# Patient Record
Sex: Female | Born: 1985 | Race: White | Hispanic: No | Smoking: Current every day smoker
Health system: Southern US, Community
[De-identification: ages and names within clinical notes are randomized; demographics above are authoritative.]

## PROBLEM LIST (undated history)

## (undated) DIAGNOSIS — E039 Hypothyroidism, unspecified: Secondary | ICD-10-CM

## (undated) DIAGNOSIS — E669 Obesity, unspecified: Secondary | ICD-10-CM

## (undated) HISTORY — PX: TUBAL LIGATION: SHX77

## (undated) HISTORY — PX: OTHER SURGICAL HISTORY: SHX169

## (undated) HISTORY — PX: CHOLECYSTECTOMY: SHX55

---

## 2016-09-24 ENCOUNTER — Ambulatory Visit (HOSPITAL_COMMUNITY)
Admission: EM | Admit: 2016-09-24 | Discharge: 2016-09-24 | Disposition: A | Discharge status: Home / Self Care | Payer: BLUE CROSS/BLUE SHIELD | Attending: Family Medicine | Admitting: Family Medicine

## 2016-09-24 ENCOUNTER — Encounter (HOSPITAL_COMMUNITY): Payer: Self-pay | Admitting: *Deleted

## 2016-09-24 DIAGNOSIS — R109 Unspecified abdominal pain: Secondary | ICD-10-CM

## 2016-09-24 HISTORY — DX: Obesity, unspecified: E66.9

## 2016-09-24 MED ORDER — KETOROLAC TROMETHAMINE 30 MG/ML IJ SOLN
INTRAMUSCULAR | Status: AC
  Filled 2016-09-24: qty 1

## 2016-09-24 MED ORDER — NAPROXEN 500 MG PO TABS: 500.0000 mg | ORAL_TABLET | Freq: Two times a day (BID) | ORAL | 0 refills | Status: DC

## 2016-09-24 MED ORDER — KETOROLAC TROMETHAMINE 30 MG/ML IJ SOLN
30.0000 mg | Freq: Once | INTRAMUSCULAR | Status: AC
  Administered 2016-09-24: 30 mg via INTRAMUSCULAR

## 2016-09-24 NOTE — Discharge Instructions (Signed)
Referral has been made to gynecology, and you've been given injection of Toradol in clinic, and I have started you on Naprosyn, one tablet twice a day. The women's outpatient clinic should call you at some point to schedule an appointment for evaluation.

## 2016-09-24 NOTE — ED Triage Notes (Signed)
Patient reports she woke up this am with severe abdominal cramping and heavy bleeding. States bleeding has now decreased. States occurrences of this has increase since getting tubes tied. No OBGYN at this time.

## 2016-09-24 NOTE — ED Provider Notes (Signed)
CSN: [id number]     Arrival date & time 09/24/16  1416 History   None    Chief Complaint  Patient presents with  . Abdominal Cramping  . Menorrhagia   (Consider location/radiation/quality/duration/timing/severity/associated sxs/prior Treatment) 31 year old female presents with dysmenorrhea and abdominal pain and cramping. This is been ongoing for 2 years, however has worsened this morning, stating she woke up "in a pool of blood" this has since stopped, she is not sexually active, reports having her "tubes tied" 2 years ago. She has no weakness, dizziness, chest pain, shortness of breath, or other markers of volume depletion, or anemia. She is not currently sexually active, not on any birth control. She is also requesting referral to gynecology.      Past Medical History:  Diagnosis Date  . Obesity    Past Surgical History:  Procedure Laterality Date  . CESAREAN SECTION    . CHOLECYSTECTOMY    . tonsilectomy    . TUBAL LIGATION     Family History  Problem Relation Age of Onset  . Diabetes Father   . Diabetes Sister    Social History  Substance Use Topics  . Smoking status: Current Every Day Smoker    Packs/day: 1.00    Years: 13.00  . Smokeless tobacco: Never Used  . Alcohol use No   OB History    Gravida Para Term Preterm AB Living   4 4 3 1  0 0   SAB TAB Ectopic Multiple Live Births   0 0 0 0 0     Review of Systems  Constitutional: Negative.   HENT: Negative.   Respiratory: Negative.   Cardiovascular: Negative.   Gastrointestinal: Positive for abdominal pain. Negative for diarrhea, nausea and vomiting.  Genitourinary: Positive for menstrual problem, pelvic pain and vaginal bleeding. Negative for difficulty urinating, dyspareunia and flank pain.  Musculoskeletal: Negative.   Skin: Negative.     Allergies  Amoxicillin  Home Medications   Prior to Admission medications   Medication Sig Start Date End Date Taking? Authorizing Provider  naproxen  (NAPROSYN) 500 MG tablet Take 1 tablet (500 mg total) by mouth 2 (two) times daily. 09/24/16   Dorena BodoKennard, Brittin Belnap, NP   Meds Ordered and Administered this Visit   Medications  ketorolac (TORADOL) 30 MG/ML injection 30 mg (30 mg Intramuscular Given 09/24/16 1537)    BP 121/70 (BP Location: Left Arm)   Pulse 74   Temp 97.8 F (36.6 C) (Oral)   Resp 16   SpO2 100%  No data found.   Physical Exam  Constitutional: She is oriented to person, place, and time. She appears well-developed and well-nourished. No distress.  HENT:  Head: Normocephalic and atraumatic.  Right Ear: External ear normal.  Left Ear: External ear normal.  Eyes: Conjunctivae are normal.  Neck: Normal range of motion.  Cardiovascular: Normal rate and regular rhythm.   Pulmonary/Chest: Effort normal and breath sounds normal.  Abdominal: Soft. Normal appearance. There is tenderness in the suprapubic area. There is no rebound.  Neurological: She is alert and oriented to person, place, and time.  Skin: Skin is warm and dry. Capillary refill takes less than 2 seconds. No rash noted. She is not diaphoretic. No erythema.  Psychiatric: She has a normal mood and affect. Her behavior is normal.  Nursing note and vitals reviewed.   Urgent Care Course     Procedures (including critical care time)  Labs Review Labs Reviewed - No data to display  Imaging Review No results found.  MDM   1. Abdominal cramping    Injection of Toradol given, prescription for Naprosyn, referral made to the women's outpatient clinic for further evaluation and management of these symptoms    Dorena BodoKennard, Fallyn Munnerlyn, NP 09/24/16 1726

## 2016-09-24 NOTE — ED Notes (Signed)
Dr hagler spoke to patient.  Patient to be seen at [facility].  Sent to lobby for intake assessment in order of arrival

## 2017-03-24 ENCOUNTER — Encounter (HOSPITAL_COMMUNITY): Payer: Self-pay | Admitting: Emergency Medicine

## 2017-03-24 ENCOUNTER — Other Ambulatory Visit: Payer: Self-pay

## 2017-03-24 DIAGNOSIS — R109 Unspecified abdominal pain: Secondary | ICD-10-CM | POA: Diagnosis not present

## 2017-03-24 DIAGNOSIS — Z79899 Other long term (current) drug therapy: Secondary | ICD-10-CM | POA: Insufficient documentation

## 2017-03-24 DIAGNOSIS — F17210 Nicotine dependence, cigarettes, uncomplicated: Secondary | ICD-10-CM | POA: Insufficient documentation

## 2017-03-24 DIAGNOSIS — R112 Nausea with vomiting, unspecified: Secondary | ICD-10-CM | POA: Diagnosis not present

## 2017-03-24 NOTE — ED Triage Notes (Signed)
Reports intermittent stabbing pain to right flank for three weeks that has become more constant.  Denies any urinary symptoms.

## 2017-03-25 ENCOUNTER — Emergency Department (HOSPITAL_COMMUNITY): Payer: BLUE CROSS/BLUE SHIELD

## 2017-03-25 ENCOUNTER — Emergency Department (HOSPITAL_COMMUNITY)
Admission: EM | Admit: 2017-03-25 | Discharge: 2017-03-25 | Disposition: A | Discharge status: Home / Self Care | Payer: BLUE CROSS/BLUE SHIELD | Attending: Emergency Medicine | Admitting: Emergency Medicine

## 2017-03-25 DIAGNOSIS — R109 Unspecified abdominal pain: Secondary | ICD-10-CM

## 2017-03-25 DIAGNOSIS — R112 Nausea with vomiting, unspecified: Secondary | ICD-10-CM | POA: Diagnosis not present

## 2017-03-25 LAB — URINALYSIS, ROUTINE W REFLEX MICROSCOPIC
Bacteria, UA: NONE SEEN
Bilirubin Urine: NEGATIVE
Glucose, UA: NEGATIVE mg/dL
Ketones, ur: NEGATIVE mg/dL
Leukocytes, UA: NEGATIVE
NITRITE: NEGATIVE
PH: 5 (ref 5.0–8.0)
Protein, ur: NEGATIVE mg/dL
SPECIFIC GRAVITY, URINE: 1.023 (ref 1.005–1.030)

## 2017-03-25 LAB — COMPREHENSIVE METABOLIC PANEL
ALBUMIN: 4.4 g/dL (ref 3.5–5.0)
ALK PHOS: 71 U/L (ref 38–126)
ALT: 20 U/L (ref 14–54)
AST: 16 U/L (ref 15–41)
Anion gap: 12 (ref 5–15)
BILIRUBIN TOTAL: 1 mg/dL (ref 0.3–1.2)
BUN: 9 mg/dL (ref 6–20)
CALCIUM: 9.3 mg/dL (ref 8.9–10.3)
CO2: 24 mmol/L (ref 22–32)
Chloride: 103 mmol/L (ref 101–111)
Creatinine, Ser: 0.73 mg/dL (ref 0.44–1.00)
GFR calc Af Amer: >60 mL/min (ref >60)
GFR calc non Af Amer: >60 mL/min (ref >60)
GLUCOSE: 85 mg/dL (ref 65–99)
Potassium: 3.6 mmol/L (ref 3.5–5.1)
Sodium: 139 mmol/L (ref 135–145)
TOTAL PROTEIN: 7.4 g/dL (ref 6.5–8.1)

## 2017-03-25 LAB — CBC WITH DIFFERENTIAL/PLATELET
BASOS ABS: 0 10*3/uL (ref 0.0–0.1)
Basophils Relative: 0 %
Eosinophils Absolute: 0.1 10*3/uL (ref 0.0–0.7)
Eosinophils Relative: 1 %
HEMATOCRIT: 35.8 % — AB (ref 36.0–46.0)
HEMOGLOBIN: 11.5 g/dL — AB (ref 12.0–15.0)
LYMPHS PCT: 29 %
Lymphs Abs: 2.3 10*3/uL (ref 0.7–4.0)
MCH: 21.2 pg — ABNORMAL LOW (ref 26.0–34.0)
MCHC: 32.1 g/dL (ref 30.0–36.0)
MCV: 66.1 fL — ABNORMAL LOW (ref 78.0–100.0)
MONOS PCT: 6 %
Monocytes Absolute: 0.5 10*3/uL (ref 0.1–1.0)
NEUTROS ABS: 5 10*3/uL (ref 1.7–7.7)
Neutrophils Relative %: 64 %
Platelets: 143 10*3/uL — ABNORMAL LOW (ref 150–400)
RBC: 5.42 MIL/uL — AB (ref 3.87–5.11)
RDW: 15.3 % (ref 11.5–15.5)
WBC: 7.9 10*3/uL (ref 4.0–10.5)

## 2017-03-25 LAB — POC URINE PREG, ED: PREG TEST UR: NEGATIVE

## 2017-03-25 MED ORDER — KETOROLAC TROMETHAMINE 30 MG/ML IJ SOLN
30.0000 mg | Freq: Once | INTRAMUSCULAR | Status: AC
  Administered 2017-03-25: 30 mg via INTRAVENOUS
  Filled 2017-03-25: qty 1

## 2017-03-25 MED ORDER — SODIUM CHLORIDE 0.9 % IV BOLUS (SEPSIS)
1000.0000 mL | Freq: Once | INTRAVENOUS | Status: AC
  Administered 2017-03-25: 1000 mL via INTRAVENOUS

## 2017-03-25 MED ORDER — METHOCARBAMOL 500 MG PO TABS: 500.0000 mg | ORAL_TABLET | Freq: Three times a day (TID) | ORAL | 0 refills | Status: DC | PRN

## 2017-03-25 MED ORDER — ONDANSETRON HCL 4 MG/2ML IJ SOLN
4.0000 mg | Freq: Once | INTRAMUSCULAR | Status: AC
  Administered 2017-03-25: 4 mg via INTRAVENOUS
  Filled 2017-03-25: qty 2

## 2017-03-25 NOTE — Discharge Instructions (Addendum)
You may alternate Tylenol 1000 mg every 6 hours as needed for pain and Ibuprofen 800 mg every 8 hours as needed for pain.  Please take Ibuprofen with food.   Your labs including your blood counts, kidney function, liver tests were normal today.  Your CT scan was also normal and showed normal-appearing kidneys, normal uterus and ovaries and normal appendix.  Your urine showed no sign of infection.  Your pregnancy test was negative.  Please alternate between Tylenol and Motrin for pain and we are discharging you with muscle relaxers.  If pain is not improving or worsens, please follow-up with your primary care provider for further management.

## 2017-03-25 NOTE — ED Provider Notes (Signed)
TIME SEEN: 2:44 AM  CHIEF COMPLAINT: Right flank pain  HPI: Patient is a 32 year old female with history of obesity, previous pyelonephritis who presents to the emergency department with 3 weeks of right flank pain.  Pain radiates into the lower aspect of the abdomen.  Described as sharp, moderate and constant.  No dysuria or hematuria.  Is currently on her menstrual cycle.  No vaginal discharge.  No history of kidney stones in the past.  No injury to her back.  Pain is not worse with movement or palpation.  Pain has caused her to have some nausea and vomiting.  No diarrhea.  No fevers.  No numbness, tingling or focal weakness.  She is ambulatory without difficulty.  No bowel or bladder incontinence.  ROS: See HPI Constitutional: no fever  Eyes: no drainage  ENT: no runny nose   Cardiovascular:  no chest pain  Resp: no SOB  GI:  vomiting GU: no dysuria Integumentary: no rash  Allergy: no hives  Musculoskeletal: no leg swelling  Neurological: no slurred speech ROS otherwise negative  PAST MEDICAL HISTORY/PAST SURGICAL HISTORY:  Past Medical History:  Diagnosis Date  . Obesity     MEDICATIONS:  Prior to Admission medications   Medication Sig Start Date End Date Taking? Authorizing Provider  naproxen (NAPROSYN) 500 MG tablet Take 1 tablet (500 mg total) by mouth 2 (two) times daily. 09/24/16   Dorena Bodo, NP    ALLERGIES:  Allergies  Allergen Reactions  . Amoxicillin     SOCIAL HISTORY:  Social History   Tobacco Use  . Smoking status: Current Every Day Smoker    Packs/day: 1.00    Years: 13.00    Pack years: 13.00  . Smokeless tobacco: Never Used  Substance Use Topics  . Alcohol use: No    FAMILY HISTORY: Family History  Problem Relation Age of Onset  . Diabetes Father   . Diabetes Sister     EXAM: BP 130/82 (BP Location: Right Arm)   Pulse 92   Temp 97.9 F (36.6 C) (Oral)   Resp 18   Ht 5\' 10"  (1.778 m)   Wt 120.2 kg (265 lb)   SpO2 100%   BMI  38.02 kg/m  CONSTITUTIONAL: Alert and oriented and responds appropriately to questions. Well-appearing; well-nourished HEAD: Normocephalic EYES: Conjunctivae clear, pupils appear equal, EOMI ENT: normal nose; moist mucous membranes NECK: Supple, no meningismus, no nuchal rigidity, no LAD  CARD: RRR; S1 and S2 appreciated; no murmurs, no clicks, no rubs, no gallops RESP: Normal chest excursion without splinting or tachypnea; breath sounds clear and equal bilaterally; no wheezes, no rhonchi, no rales, no hypoxia or respiratory distress, speaking full sentences ABD/GI: Normal bowel sounds; non-distended; soft, non-tender, no rebound, no guarding, no peritoneal signs, no hepatosplenomegaly BACK:  The back appears normal and is non-tender to palpation, there is no CVA tenderness, no midline spinal tenderness or step-off or deformity EXT: Normal ROM in all joints; non-tender to palpation; no edema; normal capillary refill; no cyanosis, no calf tenderness or swelling    SKIN: Normal color for age and race; warm; no rash NEURO: Moves all extremities equally, sensation to light touch intact diffusely, normal gait PSYCH: The patient's mood and manner are appropriate. Grooming and personal hygiene are appropriate.  MEDICAL DECISION MAKING: Patient here with right flank pain.  States that time is severe and causes her to have nausea and vomiting.  Concern for possible stone.  Abdominal exam is benign.  Doubt appendicitis, colitis, bowel  obstruction, diverticulitis.  Urine shows moderate hemoglobin but only 0-5 red blood cells and no sign of infection.  No abdominal tenderness on exam.  Will obtain labs and CT of her abdomen pelvis.  Will give IV fluids, Toradol, Zofran.  ED PROGRESS: Patient's labs are reassuring.  Normal LFTs, no leukocytosis.  CT scan shows no renal or ureteral stone.  Appendix is normal.  No hydronephrosis.  Uterus and adnexa are unremarkable.  She has no tenderness throughout her abdomen.   She has had a cholecystectomy.  Doubt PID, torsion, ovarian cyst based on benign exam and normal CT scan.  May be musculoskeletal pain.  Pain improved after Toradol.  Will discharge with ibuprofen, Robaxin and have her follow-up with her PCP.   At this time, I do not feel there is any life-threatening condition present. I have reviewed and discussed all results (EKG, imaging, lab, urine as appropriate) and exam findings with patient/family. I have reviewed nursing notes and appropriate previous records.  I feel the patient is safe to be discharged home without further emergent workup and can continue workup as an outpatient as needed. Discussed usual and customary return precautions. Patient/family verbalize understanding and are comfortable with this plan.  Outpatient follow-up has been provided if needed. All questions have been answered.      Ward, Layla MawKristen N, DO 03/25/17 [phone]

## 2017-03-25 NOTE — ED Notes (Signed)
ED Provider at bedside. 

## 2017-10-07 ENCOUNTER — Ambulatory Visit (HOSPITAL_COMMUNITY)
Admission: EM | Admit: 2017-10-07 | Discharge: 2017-10-07 | Disposition: A | Discharge status: Home / Self Care | Payer: BLUE CROSS/BLUE SHIELD | Attending: Family Medicine | Admitting: Family Medicine

## 2017-10-07 ENCOUNTER — Encounter (HOSPITAL_COMMUNITY): Payer: Self-pay | Admitting: Family Medicine

## 2017-10-07 DIAGNOSIS — R430 Anosmia: Secondary | ICD-10-CM

## 2017-10-07 DIAGNOSIS — R509 Fever, unspecified: Secondary | ICD-10-CM | POA: Diagnosis not present

## 2017-10-07 MED ORDER — AZITHROMYCIN 250 MG PO TABS: 250.0000 mg | ORAL_TABLET | Freq: Every day | ORAL | 0 refills | Status: DC

## 2017-10-07 NOTE — ED Triage Notes (Signed)
Pt sts URI sx x 4 days  

## 2017-10-07 NOTE — Discharge Instructions (Addendum)
Your symptoms could represent cat scratch affect or toxoplasmosis.  In either event the medicine prescribed should help you get over this.

## 2017-10-07 NOTE — ED Provider Notes (Addendum)
[facility]   [id number] 10/07/17 Arrival Time: 1556   SUBJECTIVE:  Abigail Rangel is a 32 y.o. female who presents to the urgent care with complaint of loss of taste and smell for the past 4 days, associated with intermittent sore throat.  There has been a low grade fever in the evening as well  Patient had cat scratches one week ago.  Patient was a smoker and is vaping now.  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  [email or web address]  Patient has recently had some spotting (S/P BTL and some cramping)   Past Medical History:  Diagnosis Date  . Obesity    Family History  Problem Relation Age of Onset  . Diabetes Father   . Diabetes Sister    Social History   Socioeconomic History  . Marital status: Legally Separated    Spouse name: Not on file  . Number of children: Not on file  . Years of education: Not on file  . Highest education level: Not on file  Occupational History  . Not on file  Social Needs  . Financial resource strain: Not on file  . Food insecurity:    Worry: Not on file    Inability: Not on file  . Transportation needs:    Medical: Not on file    Non-medical: Not on file  Tobacco Use  . Smoking status: Current Every Day Smoker    Packs/day: 1.00    Years: 13.00    Pack years: 13.00  . Smokeless tobacco: Never Used  Substance and Sexual Activity  . Alcohol use: No  . Drug use: No  . Sexual activity: Not Currently    Birth control/protection: Surgical  Lifestyle  . Physical activity:    Days per week: Not on file    Minutes per session: Not on file  . Stress: Not on file  Relationships  . Social connections:    Talks on phone: Not on file    Gets together: Not on file    Attends religious service: Not on file    Active member of club or organization: Not on file    Attends meetings of clubs or organizations: Not on file    Relationship status: Not on file  . Intimate partner violence:    Fear of current or ex  partner: Not on file    Emotionally abused: Not on file    Physically abused: Not on file    Forced sexual activity: Not on file  Other Topics Concern  . Not on file  Social History Narrative  . Not on file   No outpatient medications have been marked as taking for the 10/07/17 encounter [facility](Hospital Encounter).   Allergies  Allergen Reactions  . Amoxicillin Hives      ROS: As per HPI, remainder of ROS negative.   OBJECTIVE:   Vitals:   10/07/17 1707  BP: 120/76  Pulse: 76  Resp: 18  Temp: 98.3 F (36.8 C)  TempSrc: Oral  SpO2: 100%     General appearance: alert; no distress Eyes: PERRL; EOMI; conjunctiva normal HENT: normocephalic; atraumatic; TMs normal, canal normal, external ears normal without trauma; nasal mucosa normal; oral mucosa normal Neck: supple; minimal cervical adenopathy Lungs: clear to auscultation bilaterally Heart: regular rate and rhythm Abdomen: soft, non-tender; bowel sounds normal; no masses or organomegaly; no guarding or rebound tenderness Back: no CVA tenderness Extremities: no cyanosis or edema; symmetrical with no gross deformities Skin: warm and  dry; scratches on inner right biceps noted with no erythema. Neurologic: normal gait; grossly normal Psychological: alert and cooperative; normal mood and affect      Labs:  Results for orders placed or performed during the hospital encounter of 03/25/17  Urinalysis, Routine w reflex microscopic- may I&O cath if menses  Result Value Ref Range   Color, Urine YELLOW YELLOW   APPearance HAZY (A) CLEAR   Specific Gravity, Urine 1.023 1.005 - 1.030   pH 5.0 5.0 - 8.0   Glucose, UA NEGATIVE NEGATIVE mg/dL   Hgb urine dipstick MODERATE (A) NEGATIVE   Bilirubin Urine NEGATIVE NEGATIVE   Ketones, ur NEGATIVE NEGATIVE mg/dL   Protein, ur NEGATIVE NEGATIVE mg/dL   Nitrite NEGATIVE NEGATIVE   Leukocytes, UA NEGATIVE NEGATIVE   RBC / HPF 0-5 0 - 5 RBC/hpf   WBC, UA 0-5 0 - 5 WBC/hpf   Bacteria,  UA NONE SEEN NONE SEEN   Squamous Epithelial / LPF 0-5 (A) NONE SEEN   Mucus PRESENT   CBC with Differential  Result Value Ref Range   WBC 7.9 4.0 - 10.5 K/uL   RBC 5.42 (H) 3.87 - 5.11 MIL/uL   Hemoglobin 11.5 (L) 12.0 - 15.0 g/dL   HCT 40.9 (L) 81.1 - 91.4 %   MCV 66.1 (L) 78.0 - 100.0 fL   MCH 21.2 (L) 26.0 - 34.0 pg   MCHC 32.1 30.0 - 36.0 g/dL   RDW 78.2 95.6 - 21.3 %   Platelets 143 (L) 150 - 400 K/uL   Neutrophils Relative % 64 %   Lymphocytes Relative 29 %   Monocytes Relative 6 %   Eosinophils Relative 1 %   Basophils Relative 0 %   Neutro Abs 5.0 1.7 - 7.7 K/uL   Lymphs Abs 2.3 0.7 - 4.0 K/uL   Monocytes Absolute 0.5 0.1 - 1.0 K/uL   Eosinophils Absolute 0.1 0.0 - 0.7 K/uL   Basophils Absolute 0.0 0.0 - 0.1 K/uL   RBC Morphology TARGET CELLS   Comprehensive metabolic panel  Result Value Ref Range   Sodium 139 135 - 145 mmol/L   Potassium 3.6 3.5 - 5.1 mmol/L   Chloride 103 101 - 111 mmol/L   CO2 24 22 - 32 mmol/L   Glucose, Bld 85 65 - 99 mg/dL   BUN 9 6 - 20 mg/dL   Creatinine, Ser 0.86 0.44 - 1.00 mg/dL   Calcium 9.3 8.9 - 57.8 mg/dL   Total Protein 7.4 6.5 - 8.1 g/dL   Albumin 4.4 3.5 - 5.0 g/dL   AST 16 15 - 41 U/L   ALT 20 14 - 54 U/L   Alkaline Phosphatase 71 38 - 126 U/L   Total Bilirubin 1.0 0.3 - 1.2 mg/dL   GFR calc non Af Amer >60 >60 mL/min   GFR calc Af Amer >60 >60 mL/min   Anion gap 12 5 - 15  POC urine preg, ED  Result Value Ref Range   Preg Test, Ur NEGATIVE NEGATIVE    Labs Reviewed - No data to display  No results found.     ASSESSMENT & PLAN:  1. Anosmia   2. Fever, unspecified   Your symptoms could represent cat scratch affect or toxoplasmosis.  Another possibility is sinusitis or viral infection.  The symptoms do not allow a definite diagnosis, and blood testing is not likely to help [place] narrow down the possibilities.   In any event the medicine prescribed should help you get over [facility]  Meds ordered this encounter    Medications  . azithromycin (ZITHROMAX) 250 MG tablet    Sig: Take 1 tablet (250 mg total) by mouth daily. Take first 2 tablets together, then 1 every day until finished.    Dispense:  6 tablet    Refill:  0    Reviewed expectations re: course of current medical issues. Questions answered. Outlined signs and symptoms indicating need for more acute intervention. Patient verbalized understanding. After Visit Summary given.    Procedures:      Elvina SidleLauenstein, Idan Prime, MD 10/07/17 Rexene [id number]    Elvina SidleLauenstein, Dell Briner, MD 10/07/17 1731

## 2017-10-15 DIAGNOSIS — F329 Major depressive disorder, single episode, unspecified: Secondary | ICD-10-CM | POA: Diagnosis not present

## 2017-10-15 DIAGNOSIS — N920 Excessive and frequent menstruation with regular cycle: Secondary | ICD-10-CM | POA: Diagnosis not present

## 2017-10-15 DIAGNOSIS — N760 Acute vaginitis: Secondary | ICD-10-CM | POA: Diagnosis not present

## 2017-10-15 DIAGNOSIS — Z0000 Encounter for general adult medical examination without abnormal findings: Secondary | ICD-10-CM | POA: Diagnosis not present

## 2017-11-18 DIAGNOSIS — E559 Vitamin D deficiency, unspecified: Secondary | ICD-10-CM | POA: Diagnosis not present

## 2017-11-18 DIAGNOSIS — F17200 Nicotine dependence, unspecified, uncomplicated: Secondary | ICD-10-CM | POA: Diagnosis not present

## 2017-11-18 DIAGNOSIS — F329 Major depressive disorder, single episode, unspecified: Secondary | ICD-10-CM | POA: Diagnosis not present

## 2017-11-18 DIAGNOSIS — E039 Hypothyroidism, unspecified: Secondary | ICD-10-CM | POA: Diagnosis not present

## 2017-11-20 ENCOUNTER — Ambulatory Visit (HOSPITAL_COMMUNITY): Payer: BLUE CROSS/BLUE SHIELD | Admitting: Licensed Clinical Social Worker

## 2017-11-21 ENCOUNTER — Encounter

## 2017-11-21 ENCOUNTER — Ambulatory Visit (HOSPITAL_COMMUNITY): Payer: BLUE CROSS/BLUE SHIELD | Admitting: Licensed Clinical Social Worker

## 2017-12-09 ENCOUNTER — Encounter (HOSPITAL_COMMUNITY): Payer: Self-pay | Admitting: Licensed Clinical Social Worker

## 2017-12-09 ENCOUNTER — Ambulatory Visit (INDEPENDENT_AMBULATORY_CARE_PROVIDER_SITE_OTHER): Payer: BLUE CROSS/BLUE SHIELD | Admitting: Licensed Clinical Social Worker

## 2017-12-09 DIAGNOSIS — F32A Depression, unspecified: Secondary | ICD-10-CM | POA: Insufficient documentation

## 2017-12-09 DIAGNOSIS — F329 Major depressive disorder, single episode, unspecified: Secondary | ICD-10-CM | POA: Diagnosis not present

## 2017-12-09 DIAGNOSIS — F419 Anxiety disorder, unspecified: Secondary | ICD-10-CM | POA: Diagnosis not present
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
## 2017-12-19 DIAGNOSIS — Z716 Tobacco abuse counseling: Secondary | ICD-10-CM | POA: Diagnosis not present

## 2017-12-19 DIAGNOSIS — E559 Vitamin D deficiency, unspecified: Secondary | ICD-10-CM | POA: Diagnosis not present

## 2017-12-19 DIAGNOSIS — F329 Major depressive disorder, single episode, unspecified: Secondary | ICD-10-CM | POA: Diagnosis not present

## 2017-12-19 DIAGNOSIS — E039 Hypothyroidism, unspecified: Secondary | ICD-10-CM | POA: Diagnosis not present

## 2017-12-23 ENCOUNTER — Ambulatory Visit (INDEPENDENT_AMBULATORY_CARE_PROVIDER_SITE_OTHER): Payer: Self-pay | Admitting: Licensed Clinical Social Worker

## 2017-12-23 ENCOUNTER — Encounter (HOSPITAL_COMMUNITY): Payer: Self-pay | Admitting: Licensed Clinical Social Worker

## 2017-12-23 DIAGNOSIS — F419 Anxiety disorder, unspecified: Secondary | ICD-10-CM

## 2017-12-23 DIAGNOSIS — F329 Major depressive disorder, single episode, unspecified: Secondary | ICD-10-CM

## 2017-12-23 DIAGNOSIS — F32A Depression, unspecified: Secondary | ICD-10-CM
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
## 2018-01-07 ENCOUNTER — Ambulatory Visit (HOSPITAL_COMMUNITY): Payer: Medicaid Other | Admitting: Licensed Clinical Social Worker

## 2018-01-20 DIAGNOSIS — E559 Vitamin D deficiency, unspecified: Secondary | ICD-10-CM | POA: Diagnosis not present

## 2018-01-20 DIAGNOSIS — F329 Major depressive disorder, single episode, unspecified: Secondary | ICD-10-CM | POA: Diagnosis not present

## 2018-01-20 DIAGNOSIS — E039 Hypothyroidism, unspecified: Secondary | ICD-10-CM | POA: Diagnosis not present

## 2018-01-20 DIAGNOSIS — Z720 Tobacco use: Secondary | ICD-10-CM | POA: Diagnosis not present

## 2018-01-22 ENCOUNTER — Ambulatory Visit (INDEPENDENT_AMBULATORY_CARE_PROVIDER_SITE_OTHER): Payer: Medicaid Other | Admitting: Psychiatry

## 2018-01-22 ENCOUNTER — Encounter (HOSPITAL_COMMUNITY): Payer: Self-pay | Admitting: Psychiatry

## 2018-01-22 VITALS — BP 118/74 | Ht 70.0 in | Wt 284.0 lb

## 2018-01-22 DIAGNOSIS — F1210 Cannabis abuse, uncomplicated: Secondary | ICD-10-CM

## 2018-01-22 DIAGNOSIS — F411 Generalized anxiety disorder: Secondary | ICD-10-CM

## 2018-01-22 DIAGNOSIS — F319 Bipolar disorder, unspecified: Secondary | ICD-10-CM

## 2018-01-22 DIAGNOSIS — F4310 Post-traumatic stress disorder, unspecified: Secondary | ICD-10-CM

## 2018-01-22 MED ORDER — LAMOTRIGINE 25 MG PO TABS: ORAL_TABLET | ORAL | 1 refills | Status: DC

## 2018-01-22 MED ORDER — HYDROXYZINE PAMOATE 25 MG PO CAPS
25.0000 mg | ORAL_CAPSULE | Freq: Every evening | ORAL | 1 refills | Status: DC | PRN
Start: 2018-01-22 — End: 2018-03-29
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
## 2018-01-28 ENCOUNTER — Encounter (HOSPITAL_COMMUNITY): Payer: Self-pay | Admitting: Licensed Clinical Social Worker

## 2018-01-28 ENCOUNTER — Ambulatory Visit (INDEPENDENT_AMBULATORY_CARE_PROVIDER_SITE_OTHER): Payer: Medicaid Other | Admitting: Licensed Clinical Social Worker

## 2018-01-28 DIAGNOSIS — F319 Bipolar disorder, unspecified: Secondary | ICD-10-CM

## 2018-01-28 NOTE — Progress Notes (Signed)
   THERAPIST PROGRESS NOTE  Session Time: 2:10-3pm  Participation Level: Active  Behavioral Response: CasualAlertDepressed  Type of Therapy: Individual Therapy  Treatment Goals addressed: Improve Psychiatric Symptoms, elevate mood (increased self-esteem, increased self-compassion, increased interaction), improve unhelpful thought patterns, controlled behavior, moderate mood, deliberate speech and thought process(improved social functioning, healthy adjustment to living situation), Learn about diagnosis, healthy coping skills.  Interventions: CBT  Summary: Abigail HurtJonvia Stankovic is a 32 y.o. female who presents for her individual counseling session depressed. Pt reports she is struggling with lots of things. My partner and I are having problems, I have to go back to a job I hate in January. "I feel kind of lost." Assisted pt compartmentalizing all her "issues." Showed pt the benefit of compartmentalizing so as not to feel overwhelmed. Educated pt on problem solving skills. Discussed joy with pt, what she finds joy in her current life. Processed this with pt. Discussed with pt "check the facts" to assist with her negative thoughts and feelings. Pt saw Dr. Lolly MustacheArfeen, psychiatrist who prescribed Lamictal. She just picked up the prescription today.     Suicidal/Homicidal: Nowithout intent/plan  Therapist Response: Assessed pt's current functioning and reviewed progress. Assisted pt processing compartmentalizing, problem solving skills, check the facts. Assisted pt processing for the management of her stressors.  Plan: Return again in 2 weeks.  Diagnosis: Axis I: Anxiety and Depression        Duquan Gillooly S, LCAS 01/28/2018

## 2018-02-12 ENCOUNTER — Ambulatory Visit (HOSPITAL_COMMUNITY): Payer: Medicaid Other | Admitting: Licensed Clinical Social Worker

## 2018-03-25 ENCOUNTER — Ambulatory Visit (HOSPITAL_COMMUNITY): Payer: Medicaid Other | Admitting: Psychiatry

## 2018-03-29 ENCOUNTER — Encounter (HOSPITAL_COMMUNITY): Payer: Self-pay | Admitting: Psychiatry

## 2018-03-29 ENCOUNTER — Ambulatory Visit (INDEPENDENT_AMBULATORY_CARE_PROVIDER_SITE_OTHER): Payer: Medicaid Other | Admitting: Psychiatry

## 2018-03-29 VITALS — Ht 70.0 in | Wt 292.0 lb

## 2018-03-29 DIAGNOSIS — F319 Bipolar disorder, unspecified: Secondary | ICD-10-CM

## 2018-03-29 DIAGNOSIS — F4310 Post-traumatic stress disorder, unspecified: Secondary | ICD-10-CM

## 2018-03-29 DIAGNOSIS — F1210 Cannabis abuse, uncomplicated: Secondary | ICD-10-CM

## 2018-03-29 DIAGNOSIS — F411 Generalized anxiety disorder: Secondary | ICD-10-CM

## 2018-03-29 MED ORDER — HYDROXYZINE PAMOATE 25 MG PO CAPS: 25.0000 mg | ORAL_CAPSULE | Freq: Every evening | ORAL | 1 refills | Status: DC | PRN

## 2018-03-29 MED ORDER — LAMOTRIGINE 25 MG PO TABS: ORAL_TABLET | ORAL | 1 refills | Status: DC

## 2018-03-29 NOTE — Progress Notes (Signed)
BH MD/PA/NP OP Progress Note  03/29/2018 11:47 AM Durenda HurtJonvia Word  MRN:  [id number]  Chief Complaint: Like new medication.  It is helping my anger and irritability.  HPI: Abundio MiuJonvia is a 33 year old Caucasian female who was seen first time in November.  Patient had depression, mood swing, PTSD, anxiety and irritability.  She lives with her 35-year-old, 35 year old and 35 year old and a 35-year-old.  Patient told her husband who is a father of 35-year-old and 202 year old is now sentence for 22 years due to child molestation.  She finally got relief from that.  She is concerned about weight gain and she wanted to start working.  She has been out of work since August and she supposed to go back on January 28 but her position was filled and she is waiting for the new position.  She feels the Lamictal helped her irritability, mood swing and anger.  We have recommended to decrease Wellbutrin from 4 and 50 mg to 150 and Lexapro from 20 mg to 10 mg.  She noticed a huge improvement in her irritability.  She takes hydroxyzine at bedtime which is helping her sleep but she still feels sometimes anxious.  Her nightmares and flashbacks are less intense and less frequent.  She stopped smoking and now smoking joule.  She admitted not able to see Lavada MesiBeth McKenzie but like to reschedule appointment.  She also endorsed cut down her cannabis use significantly since she started the mood stabilizer.  She has no rash, itching, tremors or shakes.  She denies any paranoia, hallucination.  Her mood is much better and she does not recall any severe highs and lows.  We receive records from her primary care physician.  Her labs are normal other than thyroid.  She is taking thyroid medicine but she is not happy because she get a lot of weight from that.  She like to try higher dose of Lamictal since it is helping her mood.  Visit Diagnosis:    ICD-10-CM   1. Mild tetrahydrocannabinol (THC) abuse F12.10   2. PTSD (post-traumatic stress disorder)  F43.10 lamoTRIgine (LAMICTAL) 25 MG tablet    hydrOXYzine (VISTARIL) 25 MG capsule  3. Bipolar I disorder (HCC) F31.9 lamoTRIgine (LAMICTAL) 25 MG tablet    hydrOXYzine (VISTARIL) 25 MG capsule  4. GAD (generalized anxiety disorder) F41.1 hydrOXYzine (VISTARIL) 25 MG capsule    Past Psychiatric History: Reviewed. History of mood swing, anger, depression, PTSD,.  History of risky and impulsive behavior.  History of using drugs and heavy drinking.  Inpatient at age 33 in [place].  Prescribed Seroquel and Topamax but never took it upon discharge.  History of cutting but not done in a while.  PCP prescribed Wellbutrin up to 450 mg and Lexapro 20 mg which was reduced.   Past Medical History:  Past Medical History:  Diagnosis Date  . Obesity     Past Surgical History:  Procedure Laterality Date  . CESAREAN SECTION    . CHOLECYSTECTOMY    . tonsilectomy    . TUBAL LIGATION      Family Psychiatric History: Reviewed.  Family History:  Family History  Problem Relation Age of Onset  . Diabetes Father   . Diabetes Sister     Social History:  Social History   Socioeconomic History  . Marital status: Legally Separated    Spouse name: Not on file  . Number of children: Not on file  . Years of education: Not on file  . Highest education level: Not on file  Occupational History  . Not on file  Social Needs  . Financial resource strain: Not on file  . Food insecurity:    Worry: Not on file    Inability: Not on file  . Transportation needs:    Medical: Not on file    Non-medical: Not on file  Tobacco Use  . Smoking status: Current Every Day Smoker    Packs/day: 0.25    Years: 13.00    Pack years: 3.25    Types: E-cigarettes  . Smokeless tobacco: Never Used  Substance and Sexual Activity  . Alcohol use: No  . Drug use: No  . Sexual activity: Not Currently    Birth control/protection: Surgical  Lifestyle  . Physical activity:    Days per week: Not on file    Minutes  per session: Not on file  . Stress: Not on file  Relationships  . Social connections:    Talks on phone: Not on file    Gets together: Not on file    Attends religious service: Not on file    Active member of club or organization: Not on file    Attends meetings of clubs or organizations: Not on file    Relationship status: Not on file  Other Topics Concern  . Not on file  Social History Narrative  . Not on file    Allergies:  Allergies  Allergen Reactions  . Amoxicillin Hives    Metabolic Disorder Labs: No results found for: HGBA1C, MPG No results found for: PROLACTIN No results found for: CHOL, TRIG, HDL, CHOLHDL, VLDL, LDLCALC No results found for: TSH  Therapeutic Level Labs: No results found for: LITHIUM No results found for: VALPROATE No components found for:  CBMZ  Current Medications: Current Outpatient Medications  Medication Sig Dispense Refill  . buPROPion (WELLBUTRIN XL) 150 MG 24 hr tablet Take 150 mg by mouth daily.    . CHANTIX 1 MG tablet TK 1 T PO BID  5  . escitalopram (LEXAPRO) 10 MG tablet Take 10 mg by mouth daily.  5  . hydrOXYzine (VISTARIL) 25 MG capsule Take 1 capsule (25 mg total) by mouth at bedtime as needed and may repeat dose one time if needed. 45 capsule 1  . lamoTRIgine (LAMICTAL) 25 MG tablet Take one tab daily for 1 week and than 2 tab daily 60 tablet 1  . levothyroxine (SYNTHROID, LEVOTHROID) 50 MCG tablet TK 1 T PO D  12  . Vitamin D, Ergocalciferol, (DRISDOL) 1.25 MG (50000 UT) CAPS capsule TK 1 C PO 1 TIME A WK  3   No current facility-administered medications for this visit.      Musculoskeletal: Strength & Muscle Tone: within normal limits Gait & Station: normal Patient leans: N/A  Psychiatric Specialty Exam: Review of Systems  Constitutional: Negative for weight loss.  HENT: Negative.   Respiratory: Negative.   Cardiovascular: Negative.   Genitourinary: Negative.   Skin: Negative for itching and rash.   Psychiatric/Behavioral: Positive for substance abuse. The patient is nervous/anxious.     Height 5\' 10"  (1.778 m), weight 292 lb (132.5 kg).Body mass index is 41.9 kg/m.  General Appearance: Casual  Eye Contact:  Good  Speech:  Clear and Coherent  Volume:  Normal  Mood:  Anxious  Affect:  Appropriate  Thought Process:  Descriptions of Associations: Intact  Orientation:  Full (Time, Place, and Person)  Thought Content: Rumination   Suicidal Thoughts:  No  Homicidal Thoughts:  No  Memory:  Immediate;  Good Recent;   Good Remote;   Good  Judgement:  Good  Insight:  Good  Psychomotor Activity:  Normal  Concentration:  Concentration: Fair and Attention Span: Fair  Recall:  Good  Fund of Knowledge: Good  Language: Good  Akathisia:  No  Handed:  Right  AIMS (if indicated): not done  Assets:  Communication Skills Desire for Improvement Housing Resilience  ADL's:  Intact  Cognition: WNL  Sleep:  Fair   Screenings:   Assessment and Plan: Izibella is a 33 year old Caucasian female who was seen first time and now came for follow-up appointment.  She is doing better.  Recommended to try Lamictal 75 mg daily.  Continue hydroxyzine 25 mg at bedtime and extra as needed for anxiety.  She is getting Lexapro 10 mg daily and Wellbutrin XL 150 mg from primary care physician.  I reviewed blood work results from her primary care physician.  Her labs are normal except for TSH.  We discussed weight gain and encouraged to have a healthy lifestyle and watch her calorie intake.  We also discussed going back to work and she is waiting for her new position.  Overall she cut down her cannabis use.  I recommend to restart therapy with Lavada Mesi.  Patient has no rash, itching or tremors.  Recommended to call us back if she has any question or any concern.  I will see her again in 6 weeks. Time spent 30 minutes.  More than 50% of the time spent in psychoeducation, counseling and coordination of care.   Discuss safety plan that anytime having active suicidal thoughts or homicidal thoughts then patient need to call 911 or go to the local emergency room.     Cleotis Nipper, MD 03/29/2018, 11:47 AM

## 2018-04-28 ENCOUNTER — Ambulatory Visit (HOSPITAL_COMMUNITY)
Admission: EM | Admit: 2018-04-28 | Discharge: 2018-04-28 | Disposition: A | Discharge status: Home / Self Care | Payer: BLUE CROSS/BLUE SHIELD | Attending: Family Medicine | Admitting: Family Medicine

## 2018-04-28 ENCOUNTER — Encounter (HOSPITAL_COMMUNITY): Payer: Self-pay | Admitting: Emergency Medicine

## 2018-04-28 DIAGNOSIS — B9789 Other viral agents as the cause of diseases classified elsewhere: Secondary | ICD-10-CM | POA: Diagnosis not present

## 2018-04-28 DIAGNOSIS — R350 Frequency of micturition: Secondary | ICD-10-CM

## 2018-04-28 DIAGNOSIS — R05 Cough: Secondary | ICD-10-CM | POA: Diagnosis not present

## 2018-04-28 DIAGNOSIS — J069 Acute upper respiratory infection, unspecified: Secondary | ICD-10-CM | POA: Diagnosis not present

## 2018-04-28 LAB — POCT URINALYSIS DIP (DEVICE)
Bilirubin Urine: NEGATIVE
GLUCOSE, UA: NEGATIVE mg/dL
KETONES UR: NEGATIVE mg/dL
Leukocytes,Ua: NEGATIVE
Nitrite: NEGATIVE
PROTEIN: 30 mg/dL — AB
Specific Gravity, Urine: 1.025 (ref 1.005–1.030)
UROBILINOGEN UA: 0.2 mg/dL (ref 0.0–1.0)
pH: 5 (ref 5.0–8.0)

## 2018-04-28 NOTE — Discharge Instructions (Addendum)
Follow up with your primary care doctor or here if you are not seeing improvement of your symptoms over the next several days, sooner if you feel you are worsening.  Caring for yourself: Get plenty of rest. Drink plenty of fluids, enough so that your urine is light yellow or clear like water. If you have kidney, heart, or liver disease and have to limit fluids, talk with your doctor before you increase the amount of fluids you drink. Take an over-the-counter pain medicine if needed, such as acetaminophen (Tylenol), ibuprofen (Advil, Motrin), or naproxen (Aleve), to relieve fever, headache, and muscle aches. Read and follow all instructions on the label. No one younger than 20 should take aspirin. It has been linked to Reye syndrome, a serious illness. Before you use over the counter cough and cold medicines, check the label. These medicines may not be safe for children younger than age 6 or for people with certain health problems. If the skin around your nose and lips becomes sore, put some petroleum jelly on the area.  Avoid spreading a virus: Wash your hands regularly, and keep your hands away from your face.  Stay home from school, work, and other public places until you are feeling better and your fever has been gone for at least 24 hours. The fever needs to have gone away on its own without the help of medicine.  

## 2018-04-28 NOTE — ED Triage Notes (Signed)
Pt here for URI sx x 3 days  

## 2018-04-28 NOTE — ED Provider Notes (Signed)
[facility]   [id number] 04/28/18 Arrival Time: 0945  ASSESSMENT & PLAN:  1. Viral URI with cough   2. Urinary frequency    See AVS for discharge instructions. U/A without signs of infection. No glucose. Will watch symptoms closely. Self pay; no culture sent.  Work note given. Discussed typical duration of symptoms. OTC symptom care as needed. Ensure adequate fluid intake and rest. May f/u with PCP or here as needed.  Reviewed expectations re: course of current medical issues. Questions answered. Outlined signs and symptoms indicating need for more acute intervention. Patient verbalized understanding. After Visit Summary given.   SUBJECTIVE: History from: patient.  Abigail Rangel is a 33 y.o. female who presents with complaint of nasal congestion, post-nasal drainage, and a persistent dry cough; without sore throat. Onset abrupt, approx three days ago; with fatigue and with body aches. SOB: none. Wheezing: none. Fever: questions subjective. Overall normal PO intake without n/v. Known sick contacts: no. No specific or significant aggravating or alleviating factors reported. OTC treatment: Tylenol with mild help.  Received flu shot this year: no.  Also reports she has noticed slight urinary frequency since above symptoms started. More at night. No significantly increased thirst. No headaches or visual changes.  Social History   Tobacco Use  Smoking Status Current Every Day Smoker  . Packs/day: 0.25  . Years: 13.00  . Pack years: 3.25  . Types: E-cigarettes  Smokeless Tobacco Never Used    ROS: As per HPI.   OBJECTIVE:  Vitals:   04/28/18 1011  BP: 138/79  Pulse: 90  Resp: 18  Temp: 97.9 F (36.6 C)  TempSrc: Oral  SpO2: 98%     General appearance: alert; appears fatigued HEENT: nasal congestion; clear runny nose; throat irritation secondary to post-nasal drainage Neck: supple without LAD CV: RRR Lungs: unlabored respirations, symmetrical air  entry without wheezing; cough: mild Abd: soft; non-tender; no suprapubic tenderness Ext: no LE edema Skin: warm and dry Psychological: alert and cooperative; normal mood and affect   Allergies  Allergen Reactions  . Amoxicillin Hives    Past Medical History:  Diagnosis Date  . Obesity    Family History  Problem Relation Age of Onset  . Diabetes Father   . Diabetes Sister    Social History   Socioeconomic History  . Marital status: Legally Separated    Spouse name: Not on file  . Number of children: Not on file  . Years of education: Not on file  . Highest education level: Not on file  Occupational History  . Not on file  Social Needs  . Financial resource strain: Not on file  . Food insecurity:    Worry: Not on file    Inability: Not on file  . Transportation needs:    Medical: Not on file    Non-medical: Not on file  Tobacco Use  . Smoking status: Current Every Day Smoker    Packs/day: 0.25    Years: 13.00    Pack years: 3.25    Types: E-cigarettes  . Smokeless tobacco: Never Used  Substance and Sexual Activity  . Alcohol use: No  . Drug use: No  . Sexual activity: Not Currently    Birth control/protection: Surgical  Lifestyle  . Physical activity:    Days per week: Not on file    Minutes per session: Not on file  . Stress: Not on file  Relationships  . Social connections:    Talks on phone: Not on file  Gets together: Not on file    Attends religious service: Not on file    Active member of club or organization: Not on file    Attends meetings of clubs or organizations: Not on file    Relationship status: Not on file  . Intimate partner violence:    Fear of current or ex partner: Not on file    Emotionally abused: Not on file    Physically abused: Not on file    Forced sexual activity: Not on file  Other Topics Concern  . Not on file  Social History Narrative  . Not on file           Mardella Layman, MD 04/28/18 1157

## 2018-05-13 ENCOUNTER — Telehealth: Payer: BLUE CROSS/BLUE SHIELD | Admitting: Nurse Practitioner

## 2018-05-13 DIAGNOSIS — J069 Acute upper respiratory infection, unspecified: Secondary | ICD-10-CM

## 2018-05-13 MED ORDER — BENZONATATE 100 MG PO CAPS: 100.0000 mg | ORAL_CAPSULE | Freq: Three times a day (TID) | ORAL | 0 refills | Status: DC | PRN

## 2018-05-13 NOTE — Progress Notes (Signed)
E-Visit for Corona Virus Screening Based on your current symptoms, it seems unlikely that your symptoms are related to the Corona virus.   Coronavirus disease 2019 (COVID-19) is a respiratory illness that can spread from person to person. The virus that causes COVID-19 is a new virus that was first identified in the country of [place] but is now found in multiple other countries and has spread to the [place].  Symptoms associated with the virus are mild to severe fever, cough, and shortness of breath. There is currently no vaccine to protect against COVID-19, and there is no specific antiviral treatment for the virus.  It is vitally important that if you feel that you have an infection such as this virus or any other virus that you stay home and away from places where you may spread it to others.  Currently, not all patients are not being tested. If the symptoms are mild and there is not a known exposure, performing the test is not indicated.  You can use medication such as A prescription cough medication called Tessalon Perles 100 mg. You may take 1-2 capsules every 8 hours as needed for cough  Reduce your risk of any infection by using the same precautions used for avoiding the common cold or flu:  . Wash your hands often with soap and warm water for at least 20 seconds.  If soap and water are not readily available, use an alcohol-based hand sanitizer with at least 60% alcohol.  . If coughing or sneezing, cover your mouth and nose by coughing or sneezing into the elbow areas of your shirt or coat, into a tissue or into your sleeve (not your hands). . Avoid shaking hands with others and consider head nods or verbal greetings only. . Avoid touching your eyes, nose, or mouth with unwashed hands.  . Avoid close contact with people who are sick. . Avoid places or events with large numbers of people in one location, like concerts or sporting events. . Carefully consider travel plans you have or are  making. . If you are planning any travel outside or inside the [place], visit the CDC's Travelers' Health webpage for the latest health notices. . If you have some symptoms but not all symptoms, continue to monitor at home and seek medical attention if your symptoms worsen. . If you are having a medical emergency, call 911.  HOME CARE . Only take medications as instructed by your medical team. . Drink plenty of fluids and get plenty of rest. . A steam or ultrasonic humidifier can help if you have congestion.   GET HELP RIGHT AWAY IF: . You develop worsening fever. . You become short of breath . You cough up blood. . Your symptoms become more severe MAKE SURE YOU   Understand these instructions.  Will watch your condition.  Will get help right away if you are not doing well or get worse.  Your e-visit answers were reviewed by a board certified advanced clinical practitioner to complete your personal care plan.  Depending on the condition, your plan could have included both over the counter or prescription medications.  If there is a problem please reply once you have received a response from your provider. Your safety is important to us.  If you have drug allergies check your prescription carefully.    You can use MyChart to ask questions about today's visit, request a non-urgent call back, or ask for a work or school excuse for 24 hours related to   this e-Visit. If it has been greater than 24 hours you will need to follow up with your provider, or enter a new e-Visit to address those concerns. You will get an e-mail in the next two days asking about your experience.  I hope that your e-visit has been valuable and will speed your recovery. Thank you for using e-visits.   5 minutes spent reviewing and documenting in chart.  

## 2018-06-02 ENCOUNTER — Ambulatory Visit (HOSPITAL_COMMUNITY): Payer: Medicaid Other | Admitting: Psychiatry

## 2018-07-01 ENCOUNTER — Ambulatory Visit (HOSPITAL_COMMUNITY)
Admission: EM | Admit: 2018-07-01 | Discharge: 2018-07-01 | Disposition: A | Discharge status: Home / Self Care | Payer: BLUE CROSS/BLUE SHIELD | Attending: Family Medicine | Admitting: Family Medicine

## 2018-07-01 ENCOUNTER — Other Ambulatory Visit: Payer: Self-pay

## 2018-07-01 ENCOUNTER — Encounter (HOSPITAL_COMMUNITY): Payer: Self-pay

## 2018-07-01 DIAGNOSIS — L03115 Cellulitis of right lower limb: Secondary | ICD-10-CM | POA: Diagnosis not present

## 2018-07-01 MED ORDER — CEPHALEXIN 500 MG PO CAPS: 500.0000 mg | ORAL_CAPSULE | Freq: Four times a day (QID) | ORAL | 0 refills | Status: DC

## 2018-07-01 MED ORDER — TETANUS-DIPHTH-ACELL PERTUSSIS 5-2.5-18.5 LF-MCG/0.5 IM SUSP
0.5000 mL | Freq: Once | INTRAMUSCULAR | Status: AC
  Administered 2018-07-01: 0.5 mL via INTRAMUSCULAR

## 2018-07-01 MED ORDER — TETANUS-DIPHTH-ACELL PERTUSSIS 5-2.5-18.5 LF-MCG/0.5 IM SUSP
INTRAMUSCULAR | Status: AC
  Filled 2018-07-01: qty 0.5

## 2018-07-01 NOTE — Discharge Instructions (Signed)
We are treating you for infection and updating your tetanus.  Take the meds as prescribed and let us know if not improving Follow up as needed for continued or worsening symptoms

## 2018-07-01 NOTE — ED Provider Notes (Signed)
[facility]    CSN: [id number] Arrival date & time: 07/01/18  1324     History   Chief Complaint Chief Complaint  Patient presents with  . Appointment  . Wound Check    HPI Abigail Rangel is a 33 y.o. female.   Pt is a 33 year old female that was hiking last week and fell injuring the back of her  Right upper  leg.  Reports she hit leg on a large log that she was walking on.  She does not remember anything penetrating through her pants to cut her leg.  When she took her pants off she noticed significant bruising, bleeding and open wounds.  Since the bruising has somewhat improved but she still has area of redness and drainage in the back of her leg.  Painful to touch.  She has been keeping this covered and cleaning multiple times a day.  Denies any associated fevers, chills, body aches.  Unsure of last tetanus.  ROS per HPI      Past Medical History:  Diagnosis Date  . Obesity     Patient Active Problem List   Diagnosis Date Noted  . Anxiety and depression 12/09/2017    Past Surgical History:  Procedure Laterality Date  . CESAREAN SECTION    . CHOLECYSTECTOMY    . tonsilectomy    . TUBAL LIGATION      OB History    Gravida  4   Para  4   Term  3   Preterm  1   AB  0   Living  0     SAB  0   TAB  0   Ectopic  0   Multiple  0   Live Births  0            Home Medications    Prior to Admission medications   Medication Sig Start Date End Date Taking? Authorizing Provider  benzonatate (TESSALON PERLES) 100 MG capsule Take 1 capsule (100 mg total) by mouth 3 (three) times daily as needed for cough. 05/13/18   Daphine Deutscher, Mary-Margaret, FNP  buPROPion (WELLBUTRIN XL) 150 MG 24 hr tablet Take 150 mg by mouth daily.    [provider]  cephALEXin (KEFLEX) 500 MG capsule Take 1 capsule (500 mg total) by mouth 4 (four) times daily. 07/01/18   Akbar Sacra, Gloris Manchester A, NP  CHANTIX 1 MG tablet TK 1 T PO BID 01/01/18   [provider]   escitalopram (LEXAPRO) 10 MG tablet Take 10 mg by mouth daily. 12/19/17   [provider]  hydrOXYzine (VISTARIL) 25 MG capsule Take 1 capsule (25 mg total) by mouth at bedtime as needed and may repeat dose one time if needed. 03/29/18   Arfeen, Phillips Grout, MD  lamoTRIgine (LAMICTAL) 25 MG tablet Take 3 tab daily 03/29/18   Arfeen, Phillips Grout, MD  levothyroxine (SYNTHROID, LEVOTHROID) 50 MCG tablet TK 1 T PO D 12/31/17   [provider]  Vitamin D, Ergocalciferol, (DRISDOL) 1.25 MG (50000 UT) CAPS capsule TK 1 C PO 1 TIME A WK 12/31/17   [provider]    Family History Family History  Problem Relation Age of Onset  . Diabetes Father   . Diabetes Sister     Social History Social History   Tobacco Use  . Smoking status: Current Every Day Smoker    Packs/day: 0.25    Years: 13.00    Pack years: 3.25    Types: E-cigarettes  . Smokeless  tobacco: Never Used  Substance Use Topics  . Alcohol use: No  . Drug use: No     Allergies   Amoxicillin   Review of Systems Review of Systems   Physical Exam Triage Vital Signs ED Triage Vitals  Enc Vitals Group     BP 07/01/18 1348 106/64     Pulse Rate 07/01/18 1348 75     Resp 07/01/18 1348 18     Temp 07/01/18 1348 98.5 F (36.9 C)     Temp Source 07/01/18 1348 Oral     SpO2 07/01/18 1348 98 %     Weight 07/01/18 1346 300 lb (136.1 kg)     Height --      Head Circumference --      Peak Flow --      Pain Score 07/01/18 1346 5     Pain Loc --      Pain Edu? --      Excl. in GC? --    No data found.  Updated Vital Signs BP 106/64 (BP Location: Right Arm)   Pulse 75   Temp 98.5 F (36.9 C) (Oral)   Resp 18   Wt 300 lb (136.1 kg)   LMP 06/17/2018   SpO2 98%   BMI 43.05 kg/m   Visual Acuity Right Eye Distance:   Left Eye Distance:   Bilateral Distance:    Right Eye Near:   Left Eye Near:    Bilateral Near:     Physical Exam Vitals signs and nursing note reviewed.  Constitutional:       General: She is not in acute distress.    Appearance: Normal appearance. She is not ill-appearing, toxic-appearing or diaphoretic.  HENT:     Head: Normocephalic and atraumatic.  Eyes:     Conjunctiva/sclera: Conjunctivae normal.  Neck:     Musculoskeletal: Normal range of motion.  Pulmonary:     Effort: Pulmonary effort is normal.  Skin:    General: Skin is warm and dry.          Comments: Erythema, swelling and induration to the right posterior thigh.  Some open sores and drainage.  Tender to touch.   Neurological:     Mental Status: She is alert.  Psychiatric:        Mood and Affect: Mood normal.      UC Treatments / Results  Labs (all labs ordered are listed, but only abnormal results are displayed) Labs Reviewed - No data to display  EKG None  Radiology No results found.  Procedures Procedures (including critical care time)  Medications Ordered in UC Medications  Tdap (BOOSTRIX) injection 0.5 mL (0.5 mLs Intramuscular Given 07/01/18 1416)    Initial Impression / Assessment and Plan / UC Course  I have reviewed the triage vital signs and the nursing notes.  Pertinent labs & imaging results that were available during my care of the patient were reviewed by me and considered in my medical decision making (see chart for details).     Cellulitis- treating with keflex Tetanus updated.  Follow up as needed for continued or worsening symptoms  Final Clinical Impressions(s) / UC Diagnoses   Final diagnoses:  Cellulitis of right lower extremity     Discharge Instructions     We are treating you for infection and updating your tetanus.  Take the meds as prescribed and let us know if not improving Follow up as needed for continued or worsening symptoms     ED Prescriptions  Medication Sig Dispense Auth. Provider   cephALEXin (KEFLEX) 500 MG capsule Take 1 capsule (500 mg total) by mouth 4 (four) times daily. 28 capsule Dahlia Byes A, NP      Controlled Substance Prescriptions [place] Controlled Substance Registry consulted? Not Applicable   Janace Aris, NP 07/01/18 1503

## 2018-07-01 NOTE — ED Triage Notes (Signed)
Pt states she  fell down last week while walking ( hiking ) along  and now she has wound  and bruising . And it's sore and has drainage. Pt right hand middle finger  is blue and sore to the touch.

## 2018-11-19 DIAGNOSIS — Z20828 Contact with and (suspected) exposure to other viral communicable diseases: Secondary | ICD-10-CM | POA: Diagnosis not present

## 2018-11-26 DIAGNOSIS — Z20828 Contact with and (suspected) exposure to other viral communicable diseases: Secondary | ICD-10-CM | POA: Diagnosis not present

## 2019-01-21 IMAGING — CT CT RENAL STONE PROTOCOL
2 of 4 series · 16 of 46 positions shown, 18 images · non-contrast
Comparison: 07/02/2015

CLINICAL DATA: Intermittent stabbing pain to the right flank for 3
weeks, now more constant.

EXAM:
CT ABDOMEN AND PELVIS WITHOUT CONTRAST
TECHNIQUE: Multidetector CT imaging of the abdomen and pelvis was performed
following the standard protocol without IV contrast.

[Series 3: ap without · axial · non-contrast · 0.93mm/px · z∈[+712,+1172]mm · 13 of 106 slices shown, 15 images]
[im 7/106  soft-tissue]
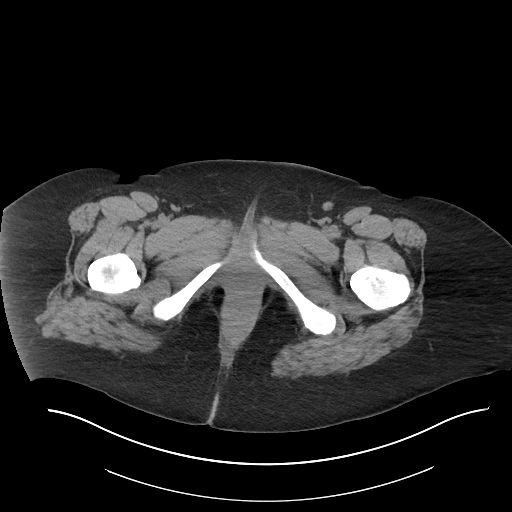
[im 7/106  bone]
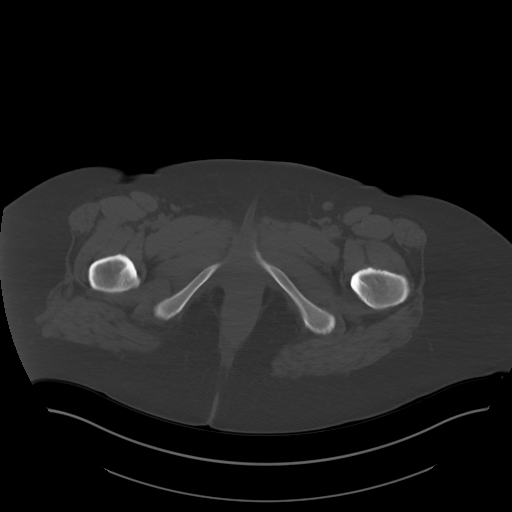
[im 13/106  soft-tissue]
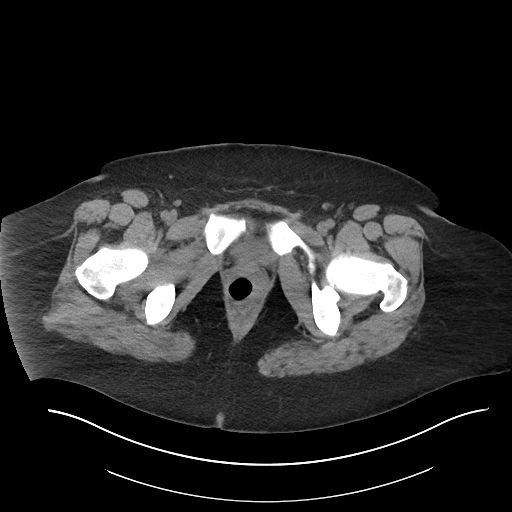
[im 25/106  soft-tissue]
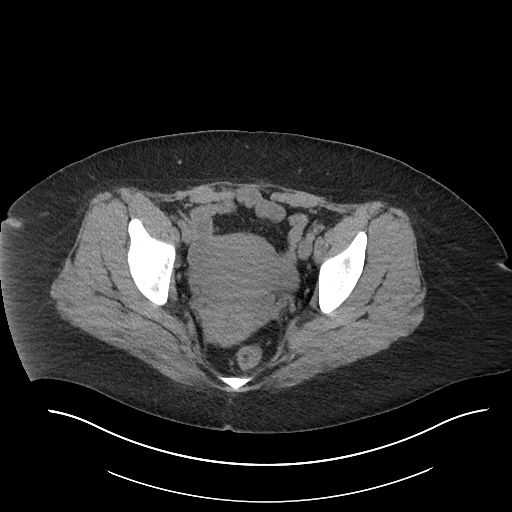
[im 31/106  soft-tissue]
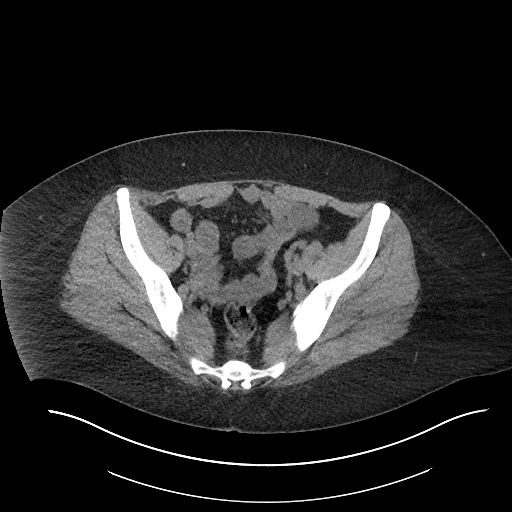
[im 38/106  soft-tissue]
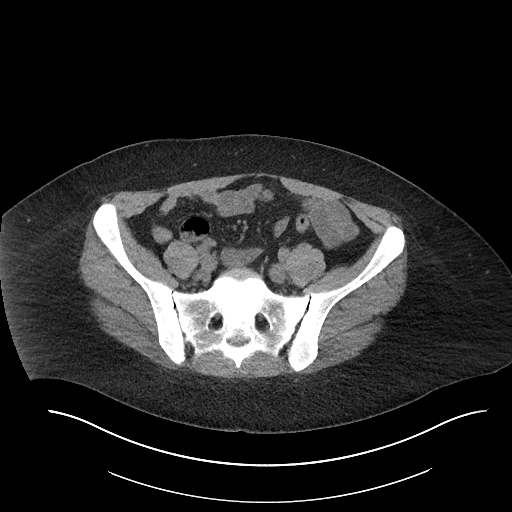
[im 44/106  soft-tissue]
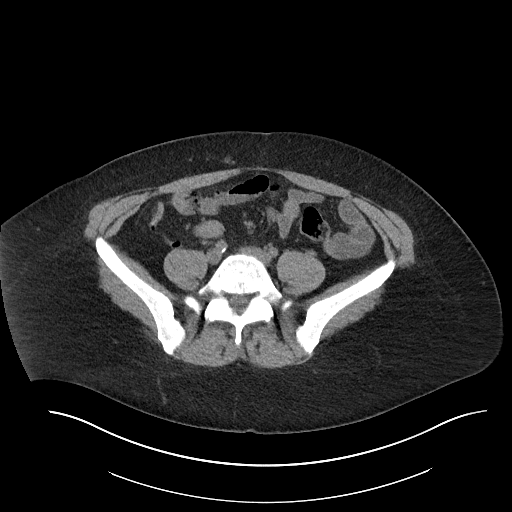
[im 56/106  soft-tissue]
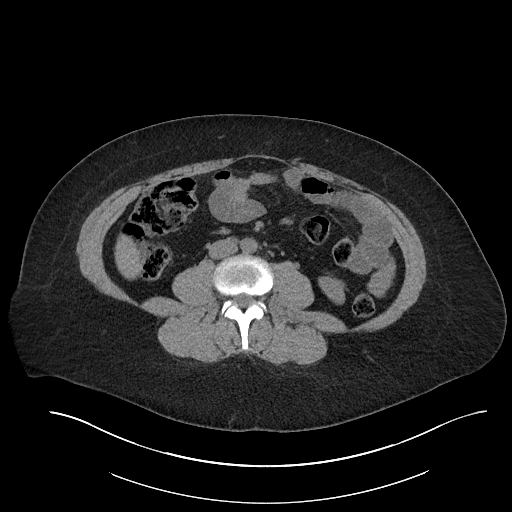
[im 62/106  soft-tissue]
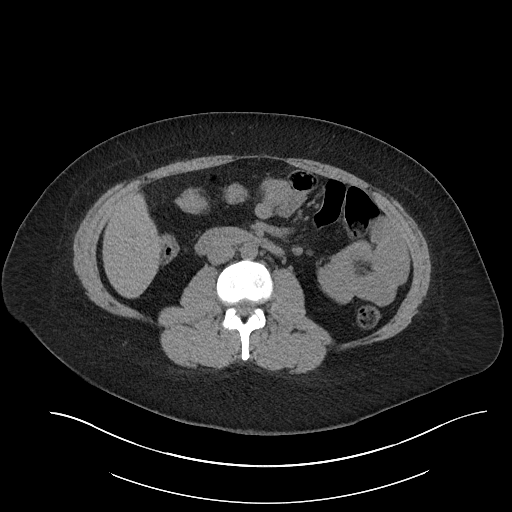
[im 68/106  soft-tissue]
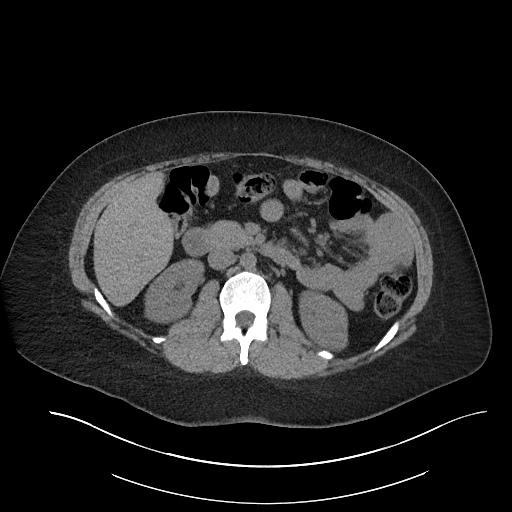
[im 68/106  bone]
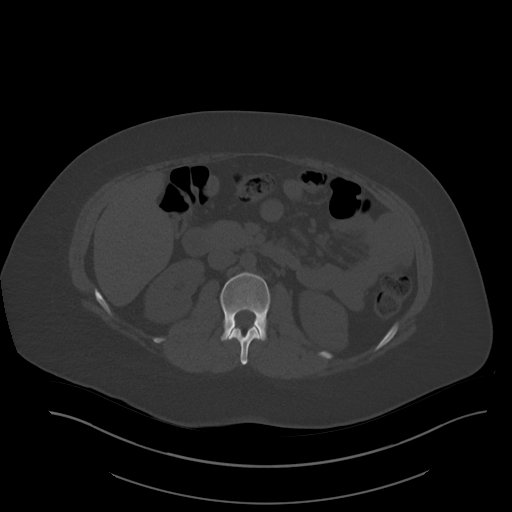
[im 75/106  soft-tissue]
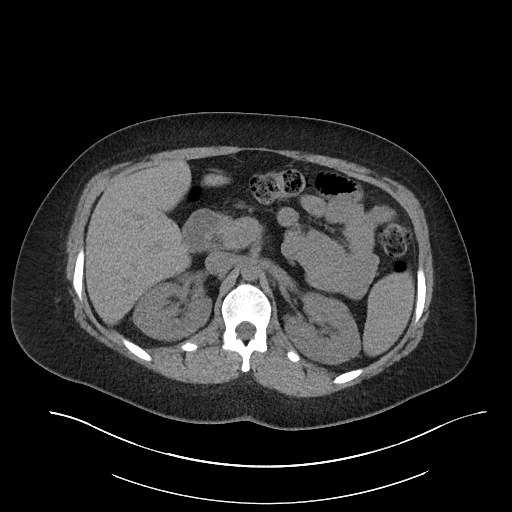
[im 81/106  soft-tissue]
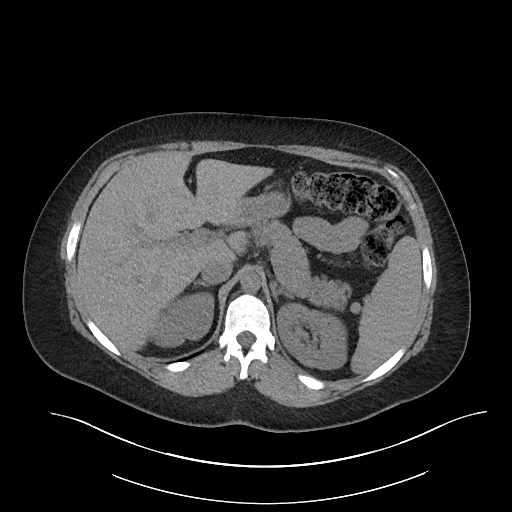
[im 93/106  soft-tissue]
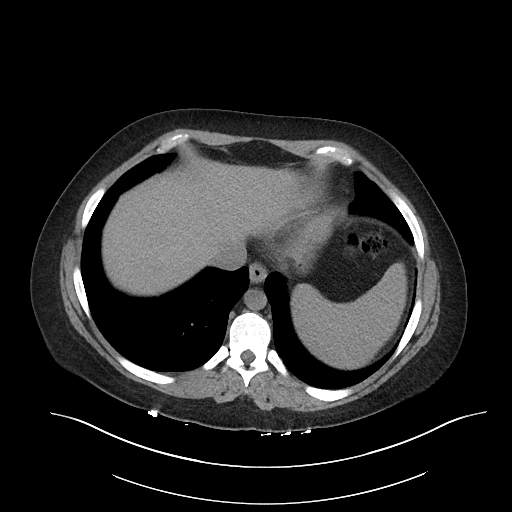
[im 99/106  soft-tissue]
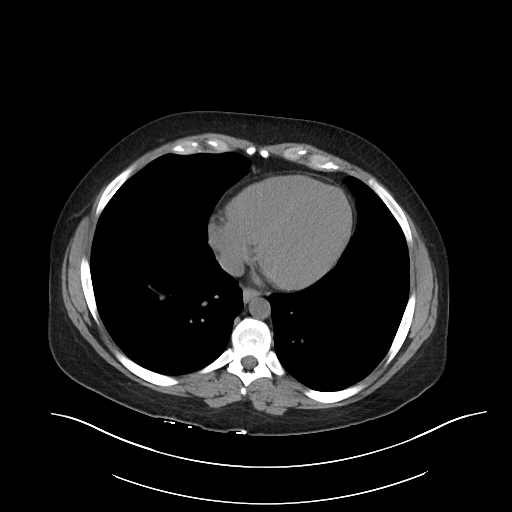

[Series 6: cor · coronal · 0.90mm/px · 3 of 101 slices shown]
[im 34/101  soft-tissue]
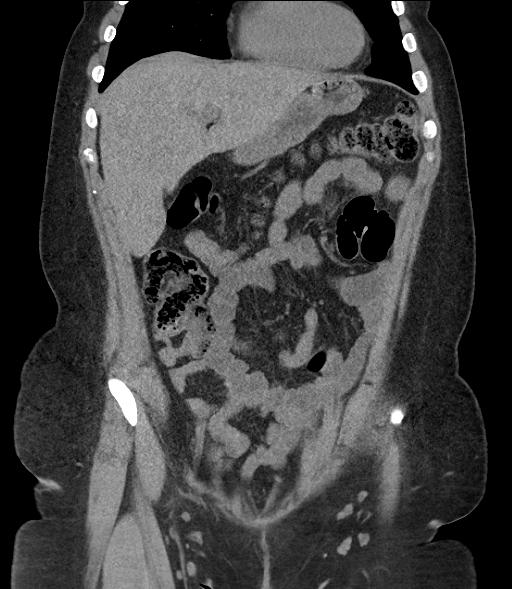
[im 45/101  soft-tissue]
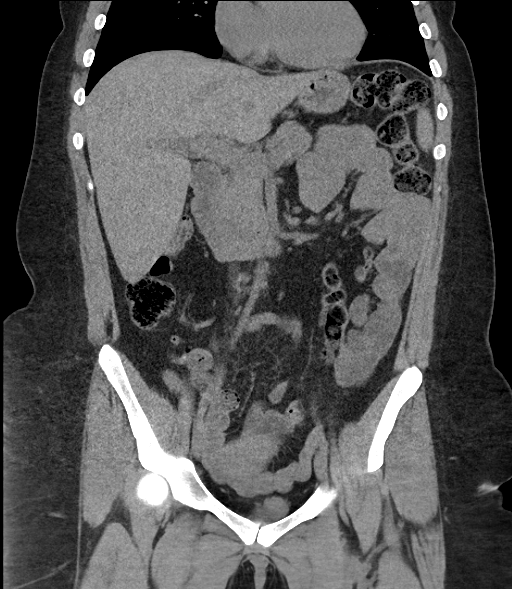
[im 56/101  soft-tissue]
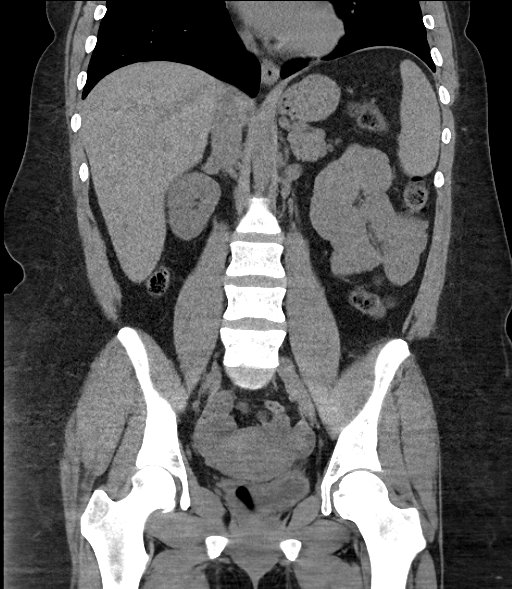

[16 of 46 positions shown; findings below may reference images not displayed]

FINDINGS: Lower chest: Lung bases are clear.

Hepatobiliary: No focal liver abnormality is seen. Status post
cholecystectomy. No biliary dilatation.

Pancreas: Unremarkable. No pancreatic ductal dilatation or
surrounding inflammatory changes.

Spleen: Normal in size without focal abnormality.

Adrenals/Urinary Tract: Adrenal glands are unremarkable. Kidneys are
normal, without renal calculi, focal lesion, or hydronephrosis.
Bladder is unremarkable.

Stomach/Bowel: Stomach is within normal limits. Appendix appears
normal. No evidence of bowel wall thickening, distention, or
inflammatory changes.

Vascular/Lymphatic: No significant vascular findings are present. No
enlarged abdominal or pelvic lymph nodes.

Reproductive: Uterus and bilateral adnexa are unremarkable.

Other: No abdominal wall hernia or abnormality. No abdominopelvic
ascites. Scarring in the low anterior abdominal wall consistent with
C-section scar.

Musculoskeletal: No acute or significant osseous findings.
IMPRESSION: No renal or ureteral stone or obstruction. No acute process
demonstrated in the abdomen or pelvis.

## 2019-04-26 ENCOUNTER — Other Ambulatory Visit: Payer: Self-pay

## 2019-04-26 ENCOUNTER — Ambulatory Visit (HOSPITAL_COMMUNITY)
Admission: EM | Admit: 2019-04-26 | Discharge: 2019-04-26 | Disposition: A | Discharge status: Home / Self Care | Payer: BC Managed Care – PPO | Attending: Urgent Care | Admitting: Urgent Care

## 2019-04-26 ENCOUNTER — Encounter (HOSPITAL_COMMUNITY): Payer: Self-pay

## 2019-04-26 DIAGNOSIS — R350 Frequency of micturition: Secondary | ICD-10-CM | POA: Insufficient documentation

## 2019-04-26 DIAGNOSIS — R300 Dysuria: Secondary | ICD-10-CM | POA: Insufficient documentation

## 2019-04-26 DIAGNOSIS — N3001 Acute cystitis with hematuria: Secondary | ICD-10-CM | POA: Insufficient documentation

## 2019-04-26 DIAGNOSIS — E860 Dehydration: Secondary | ICD-10-CM | POA: Insufficient documentation

## 2019-04-26 LAB — POCT URINALYSIS DIP (DEVICE)
Bilirubin Urine: NEGATIVE
Glucose, UA: NEGATIVE mg/dL
Ketones, ur: NEGATIVE mg/dL
Nitrite: NEGATIVE
Protein, ur: 100 mg/dL — AB
Specific Gravity, Urine: >=1.03 (ref 1.005–1.030)
Urobilinogen, UA: 1 mg/dL (ref 0.0–1.0)
pH: 6.5 (ref 5.0–8.0)

## 2019-04-26 MED ORDER — NITROFURANTOIN MONOHYD MACRO 100 MG PO CAPS: 100.0000 mg | ORAL_CAPSULE | Freq: Two times a day (BID) | ORAL | 0 refills | Status: DC

## 2019-04-26 NOTE — ED Provider Notes (Signed)
[facility]   MRN: [id number] DOB: [birth year]  Subjective:   Abigail Rangel is a 34 y.o. female presenting for 2-day history of acute onset persistent dysuria, pelvic pressure, urinary frequency and urgency.  Patient is very concerned about an UTI.  Admits that she does not drink water.  She mostly drinks coffee and diet sodas.  Denies fever, flank pain, abdominal pain, vaginal discharge or genital rash.  No current facility-administered medications for this encounter.  Current Outpatient Medications:  .  benzonatate (TESSALON PERLES) 100 MG capsule, Take 1 capsule (100 mg total) by mouth 3 (three) times daily as needed for cough. (Patient not taking: Reported on 04/26/2019), Disp: 20 capsule, Rfl: 0 .  buPROPion (WELLBUTRIN XL) 150 MG 24 hr tablet, Take 150 mg by mouth daily., Disp: , Rfl:  .  cephALEXin (KEFLEX) 500 MG capsule, Take 1 capsule (500 mg total) by mouth 4 (four) times daily. (Patient not taking: Reported on 04/26/2019), Disp: 28 capsule, Rfl: 0 .  CHANTIX 1 MG tablet, TK 1 T PO BID, Disp: , Rfl: 5 .  escitalopram (LEXAPRO) 10 MG tablet, Take 10 mg by mouth daily., Disp: , Rfl: 5 .  hydrOXYzine (VISTARIL) 25 MG capsule, Take 1 capsule (25 mg total) by mouth at bedtime as needed and may repeat dose one time if needed. (Patient not taking: Reported on 04/26/2019), Disp: 45 capsule, Rfl: 1 .  lamoTRIgine (LAMICTAL) 25 MG tablet, Take 3 tab daily (Patient not taking: Reported on 04/26/2019), Disp: 90 tablet, Rfl: 1 .  levothyroxine (SYNTHROID, LEVOTHROID) 50 MCG tablet, TK 1 T PO D, Disp: , Rfl: 12 .  Vitamin D, Ergocalciferol, (DRISDOL) 1.25 MG (50000 UT) CAPS capsule, TK 1 C PO 1 TIME A WK, Disp: , Rfl: 3   Allergies  Allergen Reactions  . Amoxicillin Hives    Past Medical History:  Diagnosis Date  . Obesity      Past Surgical History:  Procedure Laterality Date  . CESAREAN SECTION    . CHOLECYSTECTOMY    . tonsilectomy    . TUBAL LIGATION      Family History   Problem Relation Age of Onset  . Diabetes Father   . Diabetes Sister     Social History   Tobacco Use  . Smoking status: Current Every Day Smoker    Packs/day: 0.25    Years: 13.00    Pack years: 3.25    Types: E-cigarettes  . Smokeless tobacco: Never Used  Substance Use Topics  . Alcohol use: No  . Drug use: No    ROS   Objective:   Vitals: BP (!) 142/80 (BP Location: Right Arm)   Pulse 73   Temp 98.2 F (36.8 C) (Oral)   Resp 18   Wt 290 lb (131.5 kg)   LMP 04/12/2019   SpO2 100%   BMI 41.61 kg/m   Physical Exam Constitutional:      General: She is not in acute distress.    Appearance: Normal appearance. She is well-developed. She is obese. She is not ill-appearing, toxic-appearing or diaphoretic.  HENT:     Head: Normocephalic and atraumatic.     Nose: Nose normal.     Mouth/Throat:     Mouth: Mucous membranes are moist.     Pharynx: Oropharynx is clear.  Eyes:     General: No scleral icterus.    Extraocular Movements: Extraocular movements intact.     Pupils: Pupils are equal, round, and reactive to light.  Cardiovascular:  Rate and Rhythm: Normal rate.  Pulmonary:     Effort: Pulmonary effort is normal.  Abdominal:     General: Bowel sounds are normal. There is no distension.     Palpations: Abdomen is soft. There is no mass.     Tenderness: There is abdominal tenderness (Mid abdomen). There is no right CVA tenderness, left CVA tenderness, guarding or rebound.  Skin:    General: Skin is warm and dry.  Neurological:     General: No focal deficit present.     Mental Status: She is alert and oriented to person, place, and time.  Psychiatric:        Mood and Affect: Mood normal.        Behavior: Behavior normal.        Thought Content: Thought content normal.        Judgment: Judgment normal.     Results for orders placed or performed during the hospital encounter of 04/26/19 (from the past 24 hour(s))  POCT urinalysis dip (device)      Status: Abnormal   Collection Time: 04/26/19  3:19 PM  Result Value Ref Range   Glucose, UA NEGATIVE NEGATIVE mg/dL   Bilirubin Urine NEGATIVE NEGATIVE   Ketones, ur NEGATIVE NEGATIVE mg/dL   Specific Gravity, Urine >=1.030 1.005 - 1.030   Hgb urine dipstick LARGE (A) NEGATIVE   pH 6.5 5.0 - 8.0   Protein, ur 100 (A) NEGATIVE mg/dL   Urobilinogen, UA 1.0 0.0 - 1.0 mg/dL   Nitrite NEGATIVE NEGATIVE   Leukocytes,Ua SMALL (A) NEGATIVE    Assessment and Plan :   1. Acute cystitis with hematuria   2. Dysuria   3. Urinary frequency   4. Dehydration     Counseled patient that I suspect her symptoms are largely related to lack of hydration and drinking urinary irritants almost exclusively.  Encourage patient to hydrate with plain water only and avoid urinary irritants including sodas, coffee and juice.  However, patient insisted on antibiotic to address a urinary tract infection.  I was agreeable to use Macrobid, urine culture pending.  She is to use this for 3 days as this would be at this point an uncomplicated UTI due to lack of nitrites. Counseled patient on potential for adverse effects with medications prescribed/recommended today, ER and return-to-clinic precautions discussed, patient verbalized understanding.    Jaynee Eagles, PA-C 04/26/19 1544

## 2019-04-26 NOTE — ED Triage Notes (Signed)
Pt state she has a UTI x 2 days. Pt state she has pressure while voiding.

## 2019-04-28 LAB — URINE CULTURE: Culture: 20000 — AB

## 2019-05-18 ENCOUNTER — Encounter (HOSPITAL_COMMUNITY): Payer: Self-pay | Admitting: Emergency Medicine

## 2019-05-18 ENCOUNTER — Emergency Department (HOSPITAL_COMMUNITY)
Admission: EM | Admit: 2019-05-18 | Discharge: 2019-05-19 | Disposition: A | Discharge status: Home / Self Care | Payer: BC Managed Care – PPO | Attending: Emergency Medicine | Admitting: Emergency Medicine

## 2019-05-18 ENCOUNTER — Other Ambulatory Visit: Payer: Self-pay

## 2019-05-18 DIAGNOSIS — R531 Weakness: Secondary | ICD-10-CM | POA: Insufficient documentation

## 2019-05-18 DIAGNOSIS — Z5321 Procedure and treatment not carried out due to patient leaving prior to being seen by health care provider: Secondary | ICD-10-CM | POA: Diagnosis not present

## 2019-05-18 DIAGNOSIS — R0602 Shortness of breath: Secondary | ICD-10-CM | POA: Insufficient documentation

## 2019-05-18 DIAGNOSIS — R5383 Other fatigue: Secondary | ICD-10-CM | POA: Insufficient documentation

## 2019-05-18 LAB — COMPREHENSIVE METABOLIC PANEL
ALT: 23 U/L (ref 0–44)
AST: 16 U/L (ref 15–41)
Albumin: 4 g/dL (ref 3.5–5.0)
Alkaline Phosphatase: 62 U/L (ref 38–126)
Anion gap: 8 (ref 5–15)
BUN: 10 mg/dL (ref 6–20)
CO2: 25 mmol/L (ref 22–32)
Calcium: 9.5 mg/dL (ref 8.9–10.3)
Chloride: 107 mmol/L (ref 98–111)
Creatinine, Ser: 0.68 mg/dL (ref 0.44–1.00)
GFR calc Af Amer: >60 mL/min (ref >60)
GFR calc non Af Amer: >60 mL/min (ref >60)
Glucose, Bld: 81 mg/dL (ref 70–99)
Potassium: 4.1 mmol/L (ref 3.5–5.1)
Sodium: 140 mmol/L (ref 135–145)
Total Bilirubin: 0.5 mg/dL (ref 0.3–1.2)
Total Protein: 6.7 g/dL (ref 6.5–8.1)

## 2019-05-18 LAB — CBC WITH DIFFERENTIAL/PLATELET
Abs Immature Granulocytes: 0.04 10*3/uL (ref 0.00–0.07)
Basophils Absolute: 0 10*3/uL (ref 0.0–0.1)
Basophils Relative: 1 %
Eosinophils Absolute: 0.1 10*3/uL (ref 0.0–0.5)
Eosinophils Relative: 1 %
HCT: 37.9 % (ref 36.0–46.0)
Hemoglobin: 11.6 g/dL — ABNORMAL LOW (ref 12.0–15.0)
Immature Granulocytes: 1 %
Lymphocytes Relative: 19 %
Lymphs Abs: 1.6 10*3/uL (ref 0.7–4.0)
MCH: 21 pg — ABNORMAL LOW (ref 26.0–34.0)
MCHC: 30.6 g/dL (ref 30.0–36.0)
MCV: 68.7 fL — ABNORMAL LOW (ref 80.0–100.0)
Monocytes Absolute: 0.7 10*3/uL (ref 0.1–1.0)
Monocytes Relative: 8 %
Neutro Abs: 6.1 10*3/uL (ref 1.7–7.7)
Neutrophils Relative %: 70 %
Platelets: 140 10*3/uL — ABNORMAL LOW (ref 150–400)
RBC: 5.52 MIL/uL — ABNORMAL HIGH (ref 3.87–5.11)
RDW: 15.7 % — ABNORMAL HIGH (ref 11.5–15.5)
WBC: 8.5 10*3/uL (ref 4.0–10.5)
nRBC: 0 % (ref 0.0–0.2)

## 2019-05-18 LAB — I-STAT BETA HCG BLOOD, ED (MC, WL, AP ONLY): I-stat hCG, quantitative: <5 m[IU]/mL (ref <5)

## 2019-05-18 LAB — URINALYSIS, ROUTINE W REFLEX MICROSCOPIC
Bilirubin Urine: NEGATIVE
Glucose, UA: NEGATIVE mg/dL
Hgb urine dipstick: NEGATIVE
Ketones, ur: NEGATIVE mg/dL
Leukocytes,Ua: NEGATIVE
Nitrite: NEGATIVE
Protein, ur: NEGATIVE mg/dL
Specific Gravity, Urine: 1.023 (ref 1.005–1.030)
pH: 5 (ref 5.0–8.0)

## 2019-05-18 NOTE — ED Triage Notes (Signed)
Patient reports generalized weakness and fatigue this week , denies fever or chills , no emesis or diarrhea .

## 2019-05-19 NOTE — ED Notes (Signed)
Pt stated that she has to leave to take care of her kids. Pt stated that she does not want to wait any longer. Triage nurse notified.

## 2020-01-01 ENCOUNTER — Ambulatory Visit (HOSPITAL_COMMUNITY)
Admission: RE | Admit: 2020-01-01 | Discharge: 2020-01-01 | Disposition: A | Discharge status: Home / Self Care | Payer: BC Managed Care – PPO | Source: Ambulatory Visit | Attending: Family Medicine | Admitting: Family Medicine

## 2020-01-01 ENCOUNTER — Other Ambulatory Visit: Payer: Self-pay

## 2020-01-01 ENCOUNTER — Encounter (HOSPITAL_COMMUNITY): Payer: Self-pay

## 2020-01-01 VITALS — BP 134/82 | HR 75 | Temp 98.7°F | Resp 18

## 2020-01-01 DIAGNOSIS — M722 Plantar fascial fibromatosis: Secondary | ICD-10-CM

## 2020-01-01 MED ORDER — PREDNISONE 10 MG (21) PO TBPK: ORAL_TABLET | ORAL | 0 refills | Status: DC

## 2020-01-01 NOTE — ED Triage Notes (Signed)
Pt reports having left heel pain x 3 days. 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

## 2020-01-01 NOTE — ED Provider Notes (Signed)
[facility]    CSN: [id number] Arrival date & time: 01/01/20  1400      History   Chief Complaint Chief Complaint  Patient presents with  . Appointment    1400  . Foot Pain    HPI Abigail Rangel is a 34 y.o. female.   Patient is a 34 year old female presents today with left heel pain.  This is been present and worsening over the past couple weeks.  Patient works at [employer] and stands on her feet for long periods of time.  Reporting that she wears pretty supportive shoes.  Pain is not improved with rest.  Reports when she wakes up in the morning the pain has worsened.  She has not take anything for the pain.  There is mild swelling.  No injuries to the foot.     Past Medical History:  Diagnosis Date  . Obesity     Patient Active Problem List   Diagnosis Date Noted  . Anxiety and depression 12/09/2017    Past Surgical History:  Procedure Laterality Date  . CESAREAN SECTION    . CHOLECYSTECTOMY    . tonsilectomy    . TUBAL LIGATION      OB History    Gravida  4   Para  4   Term  3   Preterm  1   AB  0   Living  0     SAB  0   TAB  0   Ectopic  0   Multiple  0   Live Births  0            Home Medications    Prior to Admission medications   Medication Sig Start Date End Date Taking? Authorizing Provider  buPROPion (WELLBUTRIN XL) 150 MG 24 hr tablet Take 150 mg by mouth daily.    [provider]  CHANTIX 1 MG tablet TK 1 T PO BID 01/01/18   [provider]  escitalopram (LEXAPRO) 10 MG tablet Take 10 mg by mouth daily. 12/19/17   [provider]  levothyroxine (SYNTHROID, LEVOTHROID) 50 MCG tablet TK 1 T PO D 12/31/17   [provider]  nitrofurantoin, macrocrystal-monohydrate, (MACROBID) 100 MG capsule Take 1 capsule (100 mg total) by mouth 2 (two) times daily. 04/26/19   Wallis Bamberg, PA-C  predniSONE (STERAPRED UNI-PAK 21 TAB) 10 MG (21) TBPK tablet 6 tabs for 1 day, then 5 tabs for 1 das,  then 4 tabs for 1 day, then 3 tabs for 1 day, 2 tabs for 1 day, then 1 tab for 1 day 01/01/20   Dahlia Byes A, NP  Vitamin D, Ergocalciferol, (DRISDOL) 1.25 MG (50000 UT) CAPS capsule TK 1 C PO 1 TIME A WK 12/31/17   [provider]  lamoTRIgine (LAMICTAL) 25 MG tablet Take 3 tab daily Patient not taking: Reported on 04/26/2019 03/29/18 01/01/20  Cleotis Nipper, MD    Family History Family History  Problem Relation Age of Onset  . Diabetes Father   . Diabetes Sister     Social History Social History   Tobacco Use  . Smoking status: Current Every Day Smoker    Packs/day: 0.25    Years: 13.00    Pack years: 3.25    Types: E-cigarettes  . Smokeless tobacco: Never Used  Vaping Use  . Vaping Use: Every day  . Substances: Nicotine, Flavoring  . Devices: juul  Substance Use Topics  . Alcohol use: No  . Drug use: No  Allergies   Amoxicillin   Review of Systems Review of Systems   Physical Exam Triage Vital Signs ED Triage Vitals  Enc Vitals Group     BP 01/01/20 1447 134/82     Pulse Rate 01/01/20 1447 75     Resp 01/01/20 1447 18     Temp 01/01/20 1447 98.7 F (37.1 C)     Temp Source 01/01/20 1447 Oral     SpO2 01/01/20 1447 96 %     Weight --      Height --      Head Circumference --      Peak Flow --      Pain Score 01/01/20 1445 6     Pain Loc --      Pain Edu? --      Excl. in GC? --    No data found.  Updated Vital Signs BP 134/82 (BP Location: Right Arm)   Pulse 75   Temp 98.7 F (37.1 C) (Oral)   Resp 18   LMP  (Within Weeks) Comment: 2 weeks  SpO2 96%   Visual Acuity Right Eye Distance:   Left Eye Distance:   Bilateral Distance:    Right Eye Near:   Left Eye Near:    Bilateral Near:     Physical Exam Vitals and nursing note reviewed.  Constitutional:      General: She is not in acute distress.    Appearance: Normal appearance. She is not ill-appearing, toxic-appearing or diaphoretic.  HENT:     Head: Normocephalic.  Eyes:      Conjunctiva/sclera: Conjunctivae normal.  Pulmonary:     Effort: Pulmonary effort is normal.  Musculoskeletal:        General: Normal range of motion.     Cervical back: Normal range of motion.       Feet:  Feet:     Comments: Swelling without significant tenderness Skin:    General: Skin is warm and dry.     Findings: No rash.  Neurological:     Mental Status: She is alert.  Psychiatric:        Mood and Affect: Mood normal.      UC Treatments / Results  Labs (all labs ordered are listed, but only abnormal results are displayed) Labs Reviewed - No data to display  EKG   Radiology No results found.  Procedures Procedures (including critical care time)  Medications Ordered in UC Medications - No data to display  Initial Impression / Assessment and Plan / UC Course  I have reviewed the triage vital signs and the nursing notes.  Pertinent labs & imaging results that were available during my care of the patient were reviewed by me and considered in my medical decision making (see chart for details).     Plantar fasciitis of left foot Treated with prednisone taper for next 6 days.  Rest, ice, elevate.  Recommend inserts in shoes. Information given on plantar fasciitis and rehab Recommend follow-up podiatry for any continued issues Final Clinical Impressions(s) / UC Diagnoses   Final diagnoses:  Plantar fasciitis of left foot     Discharge Instructions     I believe this is plantar fasciitis Prednisone taper over the next 6 days. Information given on plantar fasciitis and things to do to help. Recommend inserts in shoes Rest, ice, elevate Podiatry as needed     ED Prescriptions    Medication Sig Dispense Auth. Provider   predniSONE (STERAPRED UNI-PAK 21 TAB) 10 MG (  21) TBPK tablet 6 tabs for 1 day, then 5 tabs for 1 das, then 4 tabs for 1 day, then 3 tabs for 1 day, 2 tabs for 1 day, then 1 tab for 1 day 21 tablet Clariza Sickman A, NP     PDMP not  reviewed this encounter.   Janace Aris, NP 01/01/20 1554

## 2020-01-01 NOTE — Discharge Instructions (Signed)
I believe this is plantar fasciitis Prednisone taper over the next 6 days. Information given on plantar fasciitis and things to do to help. Recommend inserts in shoes Rest, ice, elevate Podiatry as needed

## 2020-02-24 ENCOUNTER — Ambulatory Visit
Admission: RE | Admit: 2020-02-24 | Discharge: 2020-02-24 | Disposition: A | Discharge status: Home / Self Care | Payer: BC Managed Care – PPO | Source: Ambulatory Visit | Attending: Emergency Medicine | Admitting: Emergency Medicine

## 2020-02-24 ENCOUNTER — Other Ambulatory Visit: Payer: Self-pay

## 2020-02-24 VITALS — BP 132/85 | HR 97 | Temp 97.7°F | Resp 20

## 2020-02-24 DIAGNOSIS — Z20822 Contact with and (suspected) exposure to covid-19: Secondary | ICD-10-CM

## 2020-02-24 MED ORDER — FLUTICASONE PROPIONATE 50 MCG/ACT NA SUSP: 1.0000 | Freq: Every day | NASAL | 0 refills | Status: DC

## 2020-02-24 MED ORDER — IBUPROFEN 800 MG PO TABS: 800.0000 mg | ORAL_TABLET | Freq: Three times a day (TID) | ORAL | 0 refills | Status: DC

## 2020-02-24 MED ORDER — BENZONATATE 200 MG PO CAPS: 200.0000 mg | ORAL_CAPSULE | Freq: Three times a day (TID) | ORAL | 0 refills | Status: AC | PRN

## 2020-02-24 NOTE — ED Provider Notes (Signed)
[facility]    CSN: [id number] Arrival date & time: 02/24/20  1649      History   Chief Complaint Chief Complaint  Patient presents with  . Sore Throat    HPI Abigail Rangel is a 34 y.o. female presenting today for evaluation of URI symptoms.  Patient reports that over the past 2 to 3 days has had URI symptoms of cough congestion sore throat and ear pressure.  Reports son recently tested positive for Covid few days ago.  Daughter with similar symptoms as well.  Reports loss of states today as well as achy.  HPI  Past Medical History:  Diagnosis Date  . Obesity     Patient Active Problem List   Diagnosis Date Noted  . Anxiety and depression 12/09/2017    Past Surgical History:  Procedure Laterality Date  . CESAREAN SECTION    . CHOLECYSTECTOMY    . tonsilectomy    . TUBAL LIGATION      OB History    Gravida  4   Para  4   Term  3   Preterm  1   AB  0   Living  0     SAB  0   IAB  0   Ectopic  0   Multiple  0   Live Births  0            Home Medications    Prior to Admission medications   Medication Sig Start Date End Date Taking? Authorizing Provider  benzonatate (TESSALON) 200 MG capsule Take 1 capsule (200 mg total) by mouth 3 (three) times daily as needed for up to 7 days for cough. 02/24/20 03/02/20 Yes Dashonna Chagnon C, PA-C  fluticasone (FLONASE) 50 MCG/ACT nasal spray Place 1-2 sprays into both nostrils daily. 02/24/20  Yes Zaleah Ternes C, PA-C  ibuprofen (ADVIL) 800 MG tablet Take 1 tablet (800 mg total) by mouth 3 (three) times daily. 02/24/20  Yes Annsleigh Dragoo C, PA-C  buPROPion (WELLBUTRIN XL) 150 MG 24 hr tablet Take 150 mg by mouth daily.    [provider]  CHANTIX 1 MG tablet TK 1 T PO BID 01/01/18   [provider]  escitalopram (LEXAPRO) 10 MG tablet Take 10 mg by mouth daily. 12/19/17   [provider]  levothyroxine (SYNTHROID, LEVOTHROID) 50 MCG tablet TK 1 T PO D 12/31/17    [provider]  nitrofurantoin, macrocrystal-monohydrate, (MACROBID) 100 MG capsule Take 1 capsule (100 mg total) by mouth 2 (two) times daily. 04/26/19   Wallis Bamberg, PA-C  predniSONE (STERAPRED UNI-PAK 21 TAB) 10 MG (21) TBPK tablet 6 tabs for 1 day, then 5 tabs for 1 das, then 4 tabs for 1 day, then 3 tabs for 1 day, 2 tabs for 1 day, then 1 tab for 1 day 01/01/20   Dahlia Byes A, NP  Vitamin D, Ergocalciferol, (DRISDOL) 1.25 MG (50000 UT) CAPS capsule TK 1 C PO 1 TIME A WK 12/31/17   [provider]  lamoTRIgine (LAMICTAL) 25 MG tablet Take 3 tab daily Patient not taking: Reported on 04/26/2019 03/29/18 01/01/20  Cleotis Nipper, MD    Family History Family History  Problem Relation Age of Onset  . Diabetes Father   . Diabetes Sister   . Healthy Mother     Social History Social History   Tobacco Use  . Smoking status: Current Every Day Smoker    Packs/day: 0.25    Years: 13.00  Pack years: 3.25    Types: E-cigarettes  . Smokeless tobacco: Never Used  Vaping Use  . Vaping Use: Every day  . Substances: Nicotine, Flavoring  . Devices: juul  Substance Use Topics  . Alcohol use: No  . Drug use: No     Allergies   Amoxicillin   Review of Systems Review of Systems  Constitutional: Positive for chills and fatigue. Negative for activity change, appetite change and fever.  HENT: Positive for congestion, rhinorrhea, sinus pressure and sore throat. Negative for ear pain and trouble swallowing.   Eyes: Negative for discharge and redness.  Respiratory: Positive for cough. Negative for chest tightness and shortness of breath.   Cardiovascular: Negative for chest pain.  Gastrointestinal: Negative for abdominal pain, diarrhea, nausea and vomiting.  Musculoskeletal: Positive for myalgias.  Skin: Negative for rash.  Neurological: Positive for headaches. Negative for dizziness and light-headedness.     Physical Exam Triage Vital Signs ED Triage Vitals  Enc Vitals  Group     BP 02/24/20 1806 132/85     Pulse Rate 02/24/20 1806 97     Resp 02/24/20 1806 20     Temp 02/24/20 1806 97.7 F (36.5 C)     Temp Source 02/24/20 1806 Oral     SpO2 02/24/20 1806 96 %     Weight --      Height --      Head Circumference --      Peak Flow --      Pain Score 02/24/20 1802 0     Pain Loc --      Pain Edu? --      Excl. in GC? --    No data found.  Updated Vital Signs BP 132/85 (BP Location: Left Arm)   Pulse 97   Temp 97.7 F (36.5 C) (Oral)   Resp 20   LMP 02/17/2020   SpO2 96%   Visual Acuity Right Eye Distance:   Left Eye Distance:   Bilateral Distance:    Right Eye Near:   Left Eye Near:    Bilateral Near:     Physical Exam Vitals and nursing note reviewed.  Constitutional:      Appearance: She is well-developed and well-nourished.     Comments: No acute distress  HENT:     Head: Normocephalic and atraumatic.     Ears:     Comments: Bilateral ears without tenderness to palpation of external auricle, tragus and mastoid, EAC's without erythema or swelling, TM's with good bony landmarks and cone of light. Non erythematous.     Nose: Nose normal.     Mouth/Throat:     Comments: Oral mucosa pink and moist, no tonsillar enlargement or exudate. Posterior pharynx patent and nonerythematous, no uvula deviation or swelling. Normal phonation. Eyes:     Conjunctiva/sclera: Conjunctivae normal.  Cardiovascular:     Rate and Rhythm: Normal rate.  Pulmonary:     Effort: Pulmonary effort is normal. No respiratory distress.     Comments: Breathing comfortably at rest, CTABL, no wheezing, rales or other adventitious sounds auscultated Abdominal:     General: There is no distension.  Musculoskeletal:        General: Normal range of motion.     Cervical back: Neck supple.  Skin:    General: Skin is warm and dry.  Neurological:     Mental Status: She is alert and oriented to person, place, and time.  Psychiatric:        Mood  and Affect: Mood  and affect normal.      UC Treatments / Results  Labs (all labs ordered are listed, but only abnormal results are displayed) Labs Reviewed  NOVEL CORONAVIRUS, NAA    EKG   Radiology No results found.  Procedures Procedures (including critical care time)  Medications Ordered in UC Medications - No data to display  Initial Impression / Assessment and Plan / UC Course  I have reviewed the triage vital signs and the nursing notes.  Pertinent labs & imaging results that were available during my care of the patient were reviewed by me and considered in my medical decision making (see chart for details).     Covid test pending, high suspicion of Covid given Covid exposure in the house.  Exam reassuring.  Recommending symptomatic and supportive care rest and fluids.  Continue to monitor,Discussed strict return precautions. Patient verbalized understanding and is agreeable with plan.  Final Clinical Impressions(s) / UC Diagnoses   Final diagnoses:  Suspected COVID-19 virus infection     Discharge Instructions     Covid test pending, monitor my chart for results Ibuprofen and Tylenol for body aches, headaches Tessalon for cough Flonase for nasal congestion Rest and fluids Follow-up if not improving or worsening    ED Prescriptions    Medication Sig Dispense Auth. Provider   benzonatate (TESSALON) 200 MG capsule Take 1 capsule (200 mg total) by mouth 3 (three) times daily as needed for up to 7 days for cough. 28 capsule Kamarri Lovvorn C, PA-C   ibuprofen (ADVIL) 800 MG tablet Take 1 tablet (800 mg total) by mouth 3 (three) times daily. 21 tablet Roshun Klingensmith C, PA-C   fluticasone (FLONASE) 50 MCG/ACT nasal spray Place 1-2 sprays into both nostrils daily. 16 g Travez Stancil, Eudora C, PA-C     PDMP not reviewed this encounter.   Lew Dawes, [place] 02/24/20 [phone]

## 2020-02-24 NOTE — ED Triage Notes (Signed)
Pt is here with a sore throat that started Monday, he son tested POSITIVE for COVID on 02/19/2020. Pt has taken OTC meds to relieve discomfort.

## 2020-02-24 NOTE — Discharge Instructions (Addendum)
Covid test pending, monitor my chart for results Ibuprofen and Tylenol for body aches, headaches Tessalon for cough Flonase for nasal congestion Rest and fluids Follow-up if not improving or worsening

## 2020-02-25 LAB — SARS-COV-2, NAA 2 DAY TAT

## 2020-02-25 LAB — NOVEL CORONAVIRUS, NAA: SARS-CoV-2, NAA: DETECTED — AB

## 2020-06-22 ENCOUNTER — Encounter (HOSPITAL_COMMUNITY): Payer: Self-pay

## 2020-06-22 ENCOUNTER — Other Ambulatory Visit: Payer: Self-pay

## 2020-06-22 ENCOUNTER — Ambulatory Visit (HOSPITAL_COMMUNITY)
Admission: EM | Admit: 2020-06-22 | Discharge: 2020-06-22 | Disposition: A | Discharge status: Home / Self Care | Payer: BC Managed Care – PPO | Attending: Family Medicine | Admitting: Family Medicine

## 2020-06-22 DIAGNOSIS — M62830 Muscle spasm of back: Secondary | ICD-10-CM

## 2020-06-22 DIAGNOSIS — R002 Palpitations: Secondary | ICD-10-CM | POA: Diagnosis not present

## 2020-06-22 DIAGNOSIS — M549 Dorsalgia, unspecified: Secondary | ICD-10-CM

## 2020-06-22 MED ORDER — DICLOFENAC SODIUM 75 MG PO TBEC: 75.0000 mg | DELAYED_RELEASE_TABLET | Freq: Two times a day (BID) | ORAL | 0 refills | Status: DC

## 2020-06-22 MED ORDER — CYCLOBENZAPRINE HCL 10 MG PO TABS: ORAL_TABLET | ORAL | 0 refills | Status: DC

## 2020-06-22 NOTE — ED Triage Notes (Signed)
Pt c/o pain on the back of her left shoulder blade that radiates to her left arm. She states it feels like she pulled a muscle. Pt states she started to get heart palpitations since this afternoon. She states she took her BP at home and states it was normal. She denies chest pain and shoulder injury.

## 2020-06-23 NOTE — ED Provider Notes (Signed)
[facility]   [id number] 06/22/20 Arrival Time: 1742  ASSESSMENT & PLAN:  1. Palpitations   2. Spasm of muscle, back   3. Upper back pain on left side     Without chest pain or SOB. ECG: Performed today and interpreted by me: normal EKG, normal sinus rhythm. No arrhythmia appreciated.  Plans f/u with PCP. Left shoulder pain consistent with MSK etiology. Discussed.  Begin trial of: Meds ordered this encounter  Medications  . cyclobenzaprine (FLEXERIL) 10 MG tablet    Sig: Take 1 tablet by mouth before bed as needed for muscle spasm. Warning: May cause drowsiness.    Dispense:  10 tablet    Refill:  0  . diclofenac (VOLTAREN) 75 MG EC tablet    Sig: Take 1 tablet (75 mg total) by mouth 2 (two) times daily.    Dispense:  14 tablet    Refill:  0    Chest pain precautions given. Reviewed expectations re: course of current medical issues. Questions answered. Outlined signs and symptoms indicating need for more acute intervention. Patient verbalized understanding. After Visit Summary given.   SUBJECTIVE:  History from: patient. Abigail Rangel is a 35 y.o. female who presents with complaint of intermittent palpatations; h/o over past months/year but worse this afternoon. Without CP/SOB. With L shoulder/upper back pain that radiates to arm; intermittent. No MSK injury/trauma. Without neck pain. No extremity sensation changes or weakness. No OTC tx. Illicit drug use: denied.  Social History   Tobacco Use  Smoking Status Current Every Day Smoker  . Packs/day: 0.25  . Years: 13.00  . Pack years: 3.25  . Types: E-cigarettes  Smokeless Tobacco Never Used   Social History   Substance and Sexual Activity  Alcohol Use No     OBJECTIVE:  Vitals:   06/22/20 1841  BP: 121/82  Pulse: 72  Resp: 18  Temp: 98.1 F (36.7 C)  TempSrc: Oral  SpO2: 98%    General appearance: alert, oriented, no acute distress Eyes: PERRLA; EOMI; conjunctivae normal HENT:  normocephalic; atraumatic Neck: supple with FROM Lungs: without labored respirations; speaks full sentences without difficulty; CTAB Heart: regular  Chest Wall: without tenderness to palpation Back: some TTP over L trapezius muscle; no cervical midline TTP Abdomen: soft, non-tender; no guarding or rebound tenderness Extremities: without edema; without calf swelling or tenderness; symmetrical without gross deformities Skin: warm and dry; without rash or lesions Neuro: normal gait Psychological: alert and cooperative; normal mood and affect   Allergies  Allergen Reactions  . Amoxicillin Hives    Past Medical History:  Diagnosis Date  . Obesity    Social History   Socioeconomic History  . Marital status: Legally Separated    Spouse name: Not on file  . Number of children: Not on file  . Years of education: Not on file  . Highest education level: Not on file  Occupational History  . Not on file  Tobacco Use  . Smoking status: Current Every Day Smoker    Packs/day: 0.25    Years: 13.00    Pack years: 3.25    Types: E-cigarettes  . Smokeless tobacco: Never Used  Vaping Use  . Vaping Use: Every day  . Substances: Nicotine, Flavoring  . Devices: juul  Substance and Sexual Activity  . Alcohol use: No  . Drug use: No  . Sexual activity: Not Currently    Birth control/protection: Surgical  Other Topics Concern  . Not on file  Social History Narrative  .  Not on file   Social Determinants of Health   Financial Resource Strain: Not on file  Food Insecurity: Not on file  Transportation Needs: Not on file  Physical Activity: Not on file  Stress: Not on file  Social Connections: Not on file  Intimate Partner Violence: Not on file   Family History  Problem Relation Age of Onset  . Diabetes Father   . Diabetes Sister   . Healthy Mother    Past Surgical History:  Procedure Laterality Date  . CESAREAN SECTION    . CHOLECYSTECTOMY    . tonsilectomy    . TUBAL  LIGATION       Mardella Layman, MD 06/23/20 [id number]

## 2020-07-13 ENCOUNTER — Other Ambulatory Visit: Payer: Self-pay

## 2020-07-13 ENCOUNTER — Ambulatory Visit (HOSPITAL_COMMUNITY)
Admission: RE | Admit: 2020-07-13 | Discharge: 2020-07-13 | Disposition: A | Discharge status: Home / Self Care | Payer: BC Managed Care – PPO | Source: Ambulatory Visit | Attending: Family Medicine | Admitting: Family Medicine

## 2020-07-13 ENCOUNTER — Encounter (HOSPITAL_COMMUNITY): Payer: Self-pay

## 2020-07-13 VITALS — BP 121/82 | HR 78 | Temp 98.0°F | Resp 20

## 2020-07-13 DIAGNOSIS — J069 Acute upper respiratory infection, unspecified: Secondary | ICD-10-CM

## 2020-07-13 DIAGNOSIS — M25512 Pain in left shoulder: Secondary | ICD-10-CM

## 2020-07-13 DIAGNOSIS — Z20822 Contact with and (suspected) exposure to covid-19: Secondary | ICD-10-CM | POA: Diagnosis not present

## 2020-07-13 HISTORY — DX: Hypothyroidism, unspecified: E03.9

## 2020-07-13 LAB — SARS CORONAVIRUS 2 (TAT 6-24 HRS): SARS Coronavirus 2: POSITIVE — AB

## 2020-07-13 MED ORDER — PREDNISONE 20 MG PO TABS: 40.0000 mg | ORAL_TABLET | Freq: Every day | ORAL | 0 refills | Status: DC

## 2020-07-13 NOTE — ED Provider Notes (Signed)
[facility]   [id number] 07/13/20 Arrival Time: 0934  ASSESSMENT & PLAN:  1. Viral URI with cough   2. Close exposure to COVID-19 virus   3. Acute pain of left shoulder     COVID-19 testing sent. Work note provided. OTC symptom care as needed.  For shoulder pain, begin: Meds ordered this encounter  Medications  . predniSONE (DELTASONE) 20 MG tablet    Sig: Take 2 tablets (40 mg total) by mouth daily.    Dispense:  10 tablet    Refill:  0     Follow-up Information    Guadalupe Maple., MD.   Specialty: Family Medicine Why: As needed. Contact information: [address] [place] [address] [phone]        [facility].   Why: If your shoulder pain is worsening or failing to improve as anticipated. Contact information: [address] [place] [address] [phone]              Reviewed expectations re: course of current medical issues. Questions answered. Outlined signs and symptoms indicating need for more acute intervention. Understanding verbalized. After Visit Summary given.   SUBJECTIVE: History from: patient. Abigail Rangel is a 35 y.o. female who presents with worries regarding COVID-19. Known COVID-19 contact: sister. Recent travel: none. Reports: cough, congestion, fatigue; 3 days. Denies: fever and difficulty breathing. Normal PO intake without n/v/d.  Social History   Tobacco Use  Smoking Status Current Every Day Smoker  . Packs/day: 0.25  . Years: 13.00  . Pack years: 3.25  . Types: E-cigarettes  Smokeless Tobacco Never Used   Also reports continued L shoulder pain; seen here apprxo 3 w ago; NSAID and muscle relaxer without much relief. Dull pain; worse with certain movements. No extremity sensation changes or weakness. No trauma.  OBJECTIVE:  Vitals:   07/13/20 0951  BP: 121/82  Pulse: 78  Resp: 20  Temp: 98 F (36.7 C)  TempSrc: Oral  SpO2: 99%    General  appearance: alert; no distress Eyes: PERRLA; EOMI; conjunctiva normal HENT: [place]; AT; with nasal congestion Neck: supple with FROM Lungs: speaks full sentences without difficulty; unlabored Extremities: no edema; vague tenderness over post LEFT shoulder extending to mid back musculature Skin: warm and dry Neurologic: normal gait; normal distal sensation of LUE Psychological: alert and cooperative; normal mood and affect  Labs:  Labs Reviewed  SARS CORONAVIRUS 2 (TAT 6-24 HRS)     Allergies  Allergen Reactions  . Amoxicillin Hives    Past Medical History:  Diagnosis Date  . Hypothyroidism   . Obesity    Social History   Socioeconomic History  . Marital status: Legally Separated    Spouse name: Not on file  . Number of children: Not on file  . Years of education: Not on file  . Highest education level: Not on file  Occupational History  . Not on file  Tobacco Use  . Smoking status: Current Every Day Smoker    Packs/day: 0.25    Years: 13.00    Pack years: 3.25    Types: E-cigarettes  . Smokeless tobacco: Never Used  Vaping Use  . Vaping Use: Former  . Devices: juul  Substance and Sexual Activity  . Alcohol use: No  . Drug use: No  . Sexual activity: Not Currently    Birth control/protection: Surgical  Other Topics Concern  . Not on file  Social History Narrative  . Not  on file   Social Determinants of Health   Financial Resource Strain: Not on file  Food Insecurity: Not on file  Transportation Needs: Not on file  Physical Activity: Not on file  Stress: Not on file  Social Connections: Not on file  Intimate Partner Violence: Not on file   Family History  Problem Relation Age of Onset  . Diabetes Father   . Diabetes Sister   . Healthy Mother    Past Surgical History:  Procedure Laterality Date  . CESAREAN SECTION    . CHOLECYSTECTOMY    . tonsilectomy    . TUBAL LIGATION       Mardella Layman, MD 07/13/20 1106

## 2020-07-13 NOTE — ED Triage Notes (Signed)
Pt presents today nasal congestion/runny nose, cough and fatigue x 3 days. Denies fever. She does report sister has tested positive for Covid.   She also c/o of continued left shoulder pain. She was seen here three weeks ago for same. Denies injury.

## 2020-08-08 ENCOUNTER — Other Ambulatory Visit: Payer: Self-pay

## 2020-08-08 ENCOUNTER — Encounter (HOSPITAL_COMMUNITY): Payer: Self-pay

## 2020-08-08 ENCOUNTER — Ambulatory Visit (HOSPITAL_COMMUNITY)
Admission: RE | Admit: 2020-08-08 | Discharge: 2020-08-08 | Disposition: A | Discharge status: Home / Self Care | Payer: BC Managed Care – PPO | Source: Ambulatory Visit | Attending: Emergency Medicine | Admitting: Emergency Medicine

## 2020-08-08 VITALS — BP 124/76 | HR 83 | Temp 99.1°F | Resp 18

## 2020-08-08 DIAGNOSIS — L568 Other specified acute skin changes due to ultraviolet radiation: Secondary | ICD-10-CM

## 2020-08-08 MED ORDER — IBUPROFEN 600 MG PO TABS: 600.0000 mg | ORAL_TABLET | Freq: Four times a day (QID) | ORAL | 0 refills | Status: DC | PRN

## 2020-08-08 MED ORDER — PREDNISONE 10 MG (21) PO TBPK: ORAL_TABLET | ORAL | 0 refills | Status: DC

## 2020-08-08 NOTE — ED Provider Notes (Signed)
HPI  SUBJECTIVE:  Abigail Rangel is a 35 y.o. female who presents with a sunburn sustained 3 days ago of her shoulders and arms.  She states that the burn is getting better, but reports chills, feeling feverish, pins-and-needles in the area of the burn followed by soreness in her arms.  She reports arm swelling.  No change in physical activity, recent upper body use.  No direct trauma to the shoulder, neck, arms.  She tried Tylenol, cool compresses, aloe vera spray.  The Tylenol and aloe vera helped.  Symptoms are worse with coughing.  No wheezing, shortness of breath.  She took Tylenol within 2 hours of evaluation.  Past medical history negative for diabetes, hypertension.  LMP: 5/15.  Denies the possibility of being pregnant.  BOF:BPZW, Irene Shipper., MD     Past Medical History:  Diagnosis Date   Hypothyroidism    Obesity     Past Surgical History:  Procedure Laterality Date   CESAREAN SECTION     CHOLECYSTECTOMY     tonsilectomy     TUBAL LIGATION      Family History  Problem Relation Age of Onset   Diabetes Father    Diabetes Sister    Healthy Mother     Social History   Tobacco Use   Smoking status: Every Day    Packs/day: 0.25    Years: 13.00    Pack years: 3.25    Types: Cigarettes   Smokeless tobacco: Never  Vaping Use   Vaping Use: Former   Devices: juul  Substance Use Topics   Alcohol use: No   Drug use: No    No current facility-administered medications for this encounter.  Current Outpatient Medications:    ibuprofen (ADVIL) 600 MG tablet, Take 1 tablet (600 mg total) by mouth every 6 (six) hours as needed., Disp: 30 tablet, Rfl: 0   predniSONE (STERAPRED UNI-PAK 21 TAB) 10 MG (21) TBPK tablet, Dispense one 6 day pack. Take as directed with food., Disp: 21 tablet, Rfl: 0   buPROPion (WELLBUTRIN XL) 150 MG 24 hr tablet, Take 150 mg by mouth daily., Disp: , Rfl:    CHANTIX 1 MG tablet, TK 1 T PO BID, Disp: , Rfl: 5   escitalopram (LEXAPRO) 10 MG tablet, Take  10 mg by mouth daily., Disp: , Rfl: 5   fluticasone (FLONASE) 50 MCG/ACT nasal spray, Place 1-2 sprays into both nostrils daily., Disp: 16 g, Rfl: 0   levothyroxine (SYNTHROID, LEVOTHROID) 50 MCG tablet, TK 1 T PO D, Disp: , Rfl: 12  Allergies  Allergen Reactions   Amoxicillin Hives     ROS  As noted in HPI.   Physical Exam  BP 124/76   Pulse 83   Temp 99.1 F (37.3 C) (Oral)   Resp 18   LMP 07/10/2020   SpO2 99%   Constitutional: Well developed, well nourished, no acute distress Eyes:  EOMI, conjunctiva normal bilaterally HENT: Normocephalic, atraumatic,mucus membranes moist Respiratory: Normal inspiratory effort Cardiovascular: Normal rate GI: nondistended skin: Mildly tender blanchable first-degree burn over shoulders, neck, upper back.  Skin intact.  No blisters.  No induration over the ER. Musculoskeletal: Grip, bicep, tricep strength 5/5 and equal bilaterally.  Sensation light touch and temperature distally intact.  RP 2+ and equal. Neurologic: Alert & oriented x 3, no focal neuro deficits Psychiatric: Speech and behavior appropriate   ED Course   Medications - No data to display  No orders of the defined types were placed in this  encounter.   No results found for this or any previous visit (from the past 24 hour(s)). No results found.  ED Clinical Impression  1. Acute dermatitis due to solar radiation      ED Assessment/Plan  Patient with extensive sunburn.  Suspect its inflammation is causing her paresthesias.  Will send home with a prednisone Dosepak for 6 days, Tylenol/ibuprofen.  Advised aloe vera relief.  Work note for today.  Follow-up with PMD as needed.   Meds ordered this encounter  Medications   predniSONE (STERAPRED UNI-PAK 21 TAB) 10 MG (21) TBPK tablet    Sig: Dispense one 6 day pack. Take as directed with food.    Dispense:  21 tablet    Refill:  0   ibuprofen (ADVIL) 600 MG tablet    Sig: Take 1 tablet (600 mg total) by mouth every  6 (six) hours as needed.    Dispense:  30 tablet    Refill:  0      *This clinic note was created using Scientist, clinical (histocompatibility and immunogenetics). Therefore, there may be occasional mistakes despite careful proofreading.  ?    Domenick Gong, MD 08/09/20 [phone]

## 2020-08-08 NOTE — Discharge Instructions (Addendum)
Take 600 mg of ibuprofen, 1000 mg of Tylenol together 3-4 times a day as needed for pain.  Try aloe vera from the leaf.  You can buy this at a local grocery store or nothing at grocery store.  Finish the prednisone, even if you feel better.

## 2020-08-08 NOTE — ED Triage Notes (Signed)
Pt reports had sunburn Friday. States redness and pain to actual skin has improved but   that she now has pain in shoulders and arms, with pins and needles with long lasting muscle soreness after coughing, movement. Reports pain feels like throbbing. Pt also reports chills.  Reports pins and needles sensation started Saturday.

## 2020-11-14 ENCOUNTER — Ambulatory Visit (INDEPENDENT_AMBULATORY_CARE_PROVIDER_SITE_OTHER): Payer: BC Managed Care – PPO | Admitting: Primary Care

## 2020-12-01 ENCOUNTER — Ambulatory Visit (HOSPITAL_COMMUNITY): Payer: Self-pay

## 2021-06-18 ENCOUNTER — Ambulatory Visit (HOSPITAL_COMMUNITY)
Admission: EM | Admit: 2021-06-18 | Discharge: 2021-06-18 | Disposition: A | Discharge status: Home / Self Care | Payer: BC Managed Care – PPO | Attending: Emergency Medicine | Admitting: Emergency Medicine

## 2021-06-18 ENCOUNTER — Encounter (HOSPITAL_COMMUNITY): Payer: Self-pay

## 2021-06-18 DIAGNOSIS — R109 Unspecified abdominal pain: Secondary | ICD-10-CM | POA: Diagnosis not present

## 2021-06-18 DIAGNOSIS — R32 Unspecified urinary incontinence: Secondary | ICD-10-CM | POA: Diagnosis not present

## 2021-06-18 DIAGNOSIS — R350 Frequency of micturition: Secondary | ICD-10-CM | POA: Diagnosis not present

## 2021-06-18 LAB — POCT URINALYSIS DIPSTICK, ED / UC
Bilirubin Urine: NEGATIVE
Glucose, UA: NEGATIVE mg/dL
Ketones, ur: NEGATIVE mg/dL
Leukocytes,Ua: NEGATIVE
Nitrite: NEGATIVE
Protein, ur: NEGATIVE mg/dL
Specific Gravity, Urine: 1.025 (ref 1.005–1.030)
Urobilinogen, UA: 0.2 mg/dL (ref 0.0–1.0)
pH: 6 (ref 5.0–8.0)

## 2021-06-18 MED ORDER — NITROFURANTOIN MONOHYD MACRO 100 MG PO CAPS: 100.0000 mg | ORAL_CAPSULE | Freq: Two times a day (BID) | ORAL | 0 refills | Status: DC

## 2021-06-18 NOTE — Discharge Instructions (Signed)
Urine culture sent.  We will call you with abnormal results.   ?Push fluids and get plenty of rest.   ?Macrobid was prescribed/take as directed  ?Follow up with PCP if symptoms persists ?Return here or go to ER if you have any new or worsening symptoms such as fever, worsening abdominal pain, nausea/vomiting, flank pain, etc... ?

## 2021-06-18 NOTE — ED Provider Notes (Signed)
?[facility] ? ? ?Chief Complaint  ?Patient presents with  ? Flank Pain  ?  bilateral  ? ? ?SUBJECTIVE: ? ?Abigail Rangel is a 36 y.o. female who presented to the urgent care for complaint of bilateral flank pain for the past 2 days and urinary incontinence and frequency for the past week.  Patient denies a precipitating event, recent sexual encounter, excessive caffeine intake.  Localizes the pain to the lower  flank.  Pain is intermittent and describes it as achy.  Has not tried any medication for relief.  Symptoms are made worse with urination.  Admits to similar symptoms in the past.  Denies fever, chills, nausea, vomiting, abdominal pain, abnormal vaginal discharge or bleeding, hematuria.   ? ?LMP: Patient's last menstrual period was 06/08/2021 (approximate). ? ?ROS: As in HPI.  All other pertinent ROS negative.    ? ?Past Medical History:  ?Diagnosis Date  ? Hypothyroidism   ? Obesity   ? ?Past Surgical History:  ?Procedure Laterality Date  ? CESAREAN SECTION    ? CHOLECYSTECTOMY    ? tonsilectomy    ? TUBAL LIGATION    ? ?Allergies  ?Allergen Reactions  ? Amoxicillin Hives  ? ?No current facility-administered medications on file prior to encounter.  ? ?Current Outpatient Medications on File Prior to Encounter  ?Medication Sig Dispense Refill  ? buPROPion (WELLBUTRIN XL) 150 MG 24 hr tablet Take 150 mg by mouth daily.    ? CHANTIX 1 MG tablet TK 1 T PO BID  5  ? escitalopram (LEXAPRO) 10 MG tablet Take 10 mg by mouth daily.  5  ? fluticasone (FLONASE) 50 MCG/ACT nasal spray Place 1-2 sprays into both nostrils daily. 16 g 0  ? ibuprofen (ADVIL) 600 MG tablet Take 1 tablet (600 mg total) by mouth every 6 (six) hours as needed. 30 tablet 0  ? levothyroxine (SYNTHROID, LEVOTHROID) 50 MCG tablet TK 1 T PO D  12  ? predniSONE (STERAPRED UNI-PAK 21 TAB) 10 MG (21) TBPK tablet Dispense one 6 day pack. Take as directed with food. 21 tablet 0  ? [DISCONTINUED] lamoTRIgine (LAMICTAL) 25 MG tablet Take 3 tab  daily (Patient not taking: Reported on 04/26/2019) 90 tablet 1  ? ?Social History  ? ?Socioeconomic History  ? Marital status: Legally Separated  ?  Spouse name: Not on file  ? Number of children: Not on file  ? Years of education: Not on file  ? Highest education level: Not on file  ?Occupational History  ? Not on file  ?Tobacco Use  ? Smoking status: Every Day  ?  Packs/day: 0.25  ?  Years: 13.00  ?  Pack years: 3.25  ?  Types: Cigarettes  ? Smokeless tobacco: Never  ?Vaping Use  ? Vaping Use: Former  ? Devices: juul  ?Substance and Sexual Activity  ? Alcohol use: No  ? Drug use: No  ? Sexual activity: Not Currently  ?  Birth control/protection: Surgical  ?Other Topics Concern  ? Not on file  ?Social History Narrative  ? Not on file  ? ?Social Determinants of Health  ? ?Financial Resource Strain: Not on file  ?Food Insecurity: Not on file  ?Transportation Needs: Not on file  ?Physical Activity: Not on file  ?Stress: Not on file  ?Social Connections: Not on file  ?Intimate Partner Violence: Not on file  ? ?Family History  ?Problem Relation Age of Onset  ? Diabetes Father   ? Diabetes Sister   ? Healthy  Mother   ? ? ?OBJECTIVE: ? ?Vitals:  ? 06/18/21 1424  ?BP: 123/80  ?Pulse: 73  ?Resp: 18  ?Temp: 98.4 ?F (36.9 ?C)  ?TempSrc: Oral  ?SpO2: 97%  ? ?General appearance: AOx3 in no acute distress ?HEENT: NCAT.  Oropharynx clear.  ?Lungs: clear to auscultation bilaterally without adventitious breath sounds ?Heart: regular rate and rhythm.  Radial pulses 2+ symmetrical bilaterally ?Abdomen: soft; non-distended; no tenderness; bowel sounds present; no guarding or rebound tenderness ?Back: tenderness bilateral lower back ?Extremities: no edema; symmetrical with no gross deformities ?Skin: warm and dry ?Neurologic: Ambulates from chair to exam table without difficulty ?Psychological: alert and cooperative; normal mood and affect ? ?Labs Reviewed  ?POCT URINALYSIS DIPSTICK, ED / UC - Abnormal; Notable for the following  components:  ?    Result Value  ? Hgb urine dipstick MODERATE (*)   ? All other components within normal limits  ?URINE CULTURE  ? ? ?ASSESSMENT & PLAN: ? ?1. Flank pain   ?2. Urinary incontinence, unspecified type   ?3. Urinary frequency   ? ? ?Meds ordered this encounter  ?Medications  ? nitrofurantoin, macrocrystal-monohydrate, (MACROBID) 100 MG capsule  ?  Sig: Take 1 capsule (100 mg total) by mouth 2 (two) times daily.  ?  Dispense:  10 capsule  ?  Refill:  0  ? ?Macrobid was prescribed as patient was symptomatic.  Will await urine culture ? ?Discharge Instructions ? ?Urine culture sent.  We will call you with abnormal results.   ?Push fluids and get plenty of rest.   ?Macrobid was prescribed/take as directed  ?Follow up with PCP if symptoms persists ?Return here or go to ER if you have any new or worsening symptoms such as fever, worsening abdominal pain, nausea/vomiting, flank pain, etc... ? ?Outlined signs and symptoms indicating need for more acute intervention. ?Patient verbalized understanding. ?After Visit Summary given. ? ?  ?  ?Durward Parcel, FNP ?06/18/21 1441 ? ?

## 2021-06-18 NOTE — ED Triage Notes (Signed)
2 day h/o bilateral flank pain. Pt also complains of urinary incontinence and frequency that has progressively worsened within the last week. Notes some pressure and fullness of her bladder. No meds taken. No hematuria. ?

## 2021-06-20 LAB — URINE CULTURE

## 2021-07-27 ENCOUNTER — Ambulatory Visit (HOSPITAL_COMMUNITY)
Admission: RE | Admit: 2021-07-27 | Discharge: 2021-07-27 | Disposition: A | Discharge status: Home / Self Care | Payer: BC Managed Care – PPO | Source: Ambulatory Visit | Attending: Emergency Medicine | Admitting: Emergency Medicine

## 2021-07-27 ENCOUNTER — Encounter (HOSPITAL_COMMUNITY): Payer: Self-pay

## 2021-07-27 VITALS — BP 132/76 | HR 57 | Temp 98.0°F | Resp 16

## 2021-07-27 DIAGNOSIS — M5442 Lumbago with sciatica, left side: Secondary | ICD-10-CM | POA: Diagnosis not present

## 2021-07-27 MED ORDER — PREDNISONE 20 MG PO TABS: 40.0000 mg | ORAL_TABLET | Freq: Every day | ORAL | 0 refills | Status: DC

## 2021-07-27 MED ORDER — CYCLOBENZAPRINE HCL 10 MG PO TABS: 10.0000 mg | ORAL_TABLET | Freq: Two times a day (BID) | ORAL | 0 refills | Status: DC | PRN

## 2021-07-27 NOTE — ED Triage Notes (Signed)
Patient c/o LFT leg pain that started last night.   Patient endorses onset of pain began at work last night.   Patient endorses pain radiates down leg.   Patient endorses calf tightness that started today.   Patient endorses aching pain that " has been going on for months", patient was going to have the aching pain assessed " at my PCP appointment in July".   Patient has taken ibuprofen with no relief of symptoms.

## 2021-07-27 NOTE — ED Provider Notes (Signed)
[facility]    CSN: [id number] Arrival date & time: 07/27/21  1715      History   Chief Complaint Chief Complaint  Patient presents with   Leg Pain   APPT 1730    HPI Durenda HurtJonvia Topete is a 36 y.o. female.   Patient presents with left leg pain described as aching radiating down into the toes with associated numbness and tingling for 2 months.  Endorses that while walking at work last night symptoms worsen causing a sharp shooting pain.  Endorses that aching pain is typically constant worsened by periods of rest which initiate a throbbing sensation.  Symptoms are worsened by lying down.  Today she began to experience a tightness to her calf almost as if she had a cramp present.  Has attempted use of ibuprofen which has been minimally helpful.  Patient endorses urinary urgency with periods of incontinence that began prior to back pain .  Able to bear weight onto the left leg.  Able to twist turn and bend back.  Denies precipitating event, injury or trauma.   Past Medical History:  Diagnosis Date   Hypothyroidism    Obesity     Patient Active Problem List   Diagnosis Date Noted   Anxiety and depression 12/09/2017    Past Surgical History:  Procedure Laterality Date   CESAREAN SECTION     CHOLECYSTECTOMY     tonsilectomy     TUBAL LIGATION      OB History     Gravida  4   Para  4   Term  3   Preterm  1   AB  0   Living  0      SAB  0   IAB  0   Ectopic  0   Multiple  0   Live Births  0            Home Medications    Prior to Admission medications   Medication Sig Start Date End Date Taking? Authorizing Provider  ibuprofen (ADVIL) 600 MG tablet Take 1 tablet (600 mg total) by mouth every 6 (six) hours as needed. 08/08/20  Yes Domenick GongMortenson, Ashley, MD  buPROPion (WELLBUTRIN XL) 150 MG 24 hr tablet Take 150 mg by mouth daily.    [provider]  CHANTIX 1 MG tablet TK 1 T PO BID 01/01/18   [provider]  escitalopram  (LEXAPRO) 10 MG tablet Take 10 mg by mouth daily. 12/19/17   [provider]  fluticasone (FLONASE) 50 MCG/ACT nasal spray Place 1-2 sprays into both nostrils daily. 02/24/20   Wieters, Hallie C, PA-C  levothyroxine (SYNTHROID, LEVOTHROID) 50 MCG tablet TK 1 T PO D 12/31/17   [provider]  nitrofurantoin, macrocrystal-monohydrate, (MACROBID) 100 MG capsule Take 1 capsule (100 mg total) by mouth 2 (two) times daily. 06/18/21   Avegno, Zachery DakinsKomlanvi S, FNP  predniSONE (STERAPRED UNI-PAK 21 TAB) 10 MG (21) TBPK tablet Dispense one 6 day pack. Take as directed with food. 08/08/20   Domenick GongMortenson, Ashley, MD  lamoTRIgine (LAMICTAL) 25 MG tablet Take 3 tab daily Patient not taking: Reported on 04/26/2019 03/29/18 01/01/20  Cleotis NipperArfeen, Syed T, MD    Family History Family History  Problem Relation Age of Onset   Diabetes Father    Diabetes Sister    Healthy Mother     Social History Social History   Tobacco Use   Smoking status: Every Day    Packs/day: 0.25    Years: 13.00  Pack years: 3.25    Types: Cigarettes   Smokeless tobacco: Never  Vaping Use   Vaping Use: Former   Devices: juul  Substance Use Topics   Alcohol use: No   Drug use: No     Allergies   Amoxicillin   Review of Systems Review of Systems  Constitutional: Negative.   Respiratory: Negative.    Cardiovascular: Negative.   Musculoskeletal:  Positive for myalgias. Negative for arthralgias, back pain, gait problem, joint swelling, neck pain and neck stiffness.  Skin: Negative.   Neurological: Negative.     Physical Exam Triage Vital Signs ED Triage Vitals  Enc Vitals Group     BP 07/27/21 1745 132/76     Pulse Rate 07/27/21 1745 (!) 57     Resp 07/27/21 1745 16     Temp 07/27/21 1745 98 F (36.7 C)     Temp Source 07/27/21 1745 Oral     SpO2 07/27/21 1745 96 %     Weight --      Height --      Head Circumference --      Peak Flow --      Pain Score 07/27/21 1750 4     Pain Loc --      Pain Edu?  --      Excl. in GC? --    No data found.  Updated Vital Signs BP 132/76 (BP Location: Left Arm)   Pulse (!) 57   Temp 98 F (36.7 C) (Oral)   Resp 16   LMP 07/16/2021 (Approximate)   SpO2 96%   Visual Acuity Right Eye Distance:   Left Eye Distance:   Bilateral Distance:    Right Eye Near:   Left Eye Near:    Bilateral Near:     Physical Exam Constitutional:      Appearance: Normal appearance.  HENT:     Head: Normocephalic.  Eyes:     Extraocular Movements: Extraocular movements intact.  Pulmonary:     Effort: Pulmonary effort is normal.  Musculoskeletal:     Comments: Tenderness over the midline and left-sided lumbar region, no ecchymosis, swelling or deformity present, range of motion of the left leg intact, able to bear weight, 2+ femoral pulse, able to walk into exam room without assistance.  Skin:    General: Skin is warm and dry.  Neurological:     Mental Status: She is alert and oriented to person, place, and time. Mental status is at baseline.  Psychiatric:        Mood and Affect: Mood normal.        Behavior: Behavior normal.     UC Treatments / Results  Labs (all labs ordered are listed, but only abnormal results are displayed) Labs Reviewed - No data to display  EKG   Radiology No results found.  Procedures Procedures (including critical care time)  Medications Ordered in UC Medications - No data to display  Initial Impression / Assessment and Plan / UC Course  I have reviewed the triage vital signs and the nursing notes.  Pertinent labs & imaging results that were available during my care of the patient were reviewed by me and considered in my medical decision making (see chart for details).  Acute left-sided low back pain with sciatica  Etiology is most likely muscular however as symptoms have been present for 2 months, advised patient to follow-up with orthopedics, given walking referral to to local practices of patient's choosing,  declined injection in  office, prescribed prednisone 40 mg burst and Flexeril for outpatient use, recommended RICE, heat, pillows for support, daily stretching and activity as tolerated Final Clinical Impressions(s) / UC Diagnoses   Final diagnoses:  None   Discharge Instructions   None    ED Prescriptions   None    PDMP not reviewed this encounter.   Valinda Hoar, [place] 07/27/21 1830

## 2021-07-27 NOTE — Discharge Instructions (Signed)
Your discomfort and pain is coming from your back compressing a nerve which is causing pain to radiate down your leg  Today we will work to reduce inflammation which ideally will minimize your pain  Starting tomorrow take prednisone every morning with food for the next 5 days, while using this medicine you may use Tylenol 500 to 1000 mg every 6 hours for additional comfort, once medication has been stopped you may begin to use ibuprofen again  May use Flexeril twice daily for additional comfort, be mindful of this medication may make you drowsy, if this occurs you may attempt use of half a tablet or use at bedtime  You may use heating pad in 15 minute intervals as needed for additional comfort,or you may find comfort in using ice in 10-15 minutes over affected area  Begin stretching affected area daily for 10 minutes as tolerated to further loosen muscles   When lying down place pillow underneath and between knees for support  Can try sleeping without pillow on firm mattress   Practice good posture: head back, shoulders back, chest forward, pelvis back and weight distributed evenly on both legs  You will need orthopedic follow-up for reevaluation and management of your back pain if it continues or recurs, you have been given information to to local practices, please reach out and schedule an appoint

## 2021-09-01 ENCOUNTER — Ambulatory Visit (INDEPENDENT_AMBULATORY_CARE_PROVIDER_SITE_OTHER): Payer: BC Managed Care – PPO | Admitting: Primary Care

## 2021-09-01 ENCOUNTER — Encounter (INDEPENDENT_AMBULATORY_CARE_PROVIDER_SITE_OTHER): Payer: Self-pay | Admitting: Primary Care

## 2021-09-01 VITALS — BP 126/75 | HR 70 | Temp 97.6°F | Ht 69.5 in | Wt 298.4 lb

## 2021-09-01 DIAGNOSIS — Z13220 Encounter for screening for lipoid disorders: Secondary | ICD-10-CM | POA: Diagnosis not present

## 2021-09-01 DIAGNOSIS — F32A Depression, unspecified: Secondary | ICD-10-CM | POA: Diagnosis not present

## 2021-09-01 DIAGNOSIS — Z131 Encounter for screening for diabetes mellitus: Secondary | ICD-10-CM | POA: Diagnosis not present

## 2021-09-01 DIAGNOSIS — G4700 Insomnia, unspecified: Secondary | ICD-10-CM

## 2021-09-01 DIAGNOSIS — E559 Vitamin D deficiency, unspecified: Secondary | ICD-10-CM | POA: Diagnosis not present

## 2021-09-01 DIAGNOSIS — R5383 Other fatigue: Secondary | ICD-10-CM

## 2021-09-01 DIAGNOSIS — E039 Hypothyroidism, unspecified: Secondary | ICD-10-CM | POA: Diagnosis not present

## 2021-09-01 DIAGNOSIS — K5900 Constipation, unspecified: Secondary | ICD-10-CM

## 2021-09-01 DIAGNOSIS — F419 Anxiety disorder, unspecified: Secondary | ICD-10-CM | POA: Diagnosis not present

## 2021-09-01 LAB — POCT GLYCOSYLATED HEMOGLOBIN (HGB A1C): Hemoglobin A1C: 5.3 % (ref 4.0–5.6)

## 2021-09-01 MED ORDER — TRAZODONE HCL 50 MG PO TABS: 25.0000 mg | ORAL_TABLET | Freq: Every evening | ORAL | 3 refills | Status: DC | PRN

## 2021-09-01 MED ORDER — LUBIPROSTONE 8 MCG PO CAPS: 8.0000 ug | ORAL_CAPSULE | Freq: Two times a day (BID) | ORAL | 1 refills | Status: DC

## 2021-09-01 NOTE — Progress Notes (Signed)
New Patient Office Visit  Subjective    Patient ID: Abigail Rangel, female    DOB: Oct 21, 1985  Age: 36 y.o. MRN: [id number]  CC:  Chief Complaint  Patient presents with   New Patient (Initial Visit)    Hypothyroidism/depression/pain from lower back that radiates down left leg     HPI Ms. Abigail Rangel is a 36 year old female who presents to establish care with her partner Abigail Rangel which who encouraged her to see PCP. Patient gave permission for partner to be present. She voices concerns about thyroid levels , depression -hx of PTSD, bipolar, and anxiety  Flowsheet Row Office Visit from 09/01/2021 in [facility]  PHQ-9 Total Score 22      And lower back pain that radiates down her left leg.    Outpatient Encounter Medications as of 09/01/2021  Medication Sig   buPROPion (WELLBUTRIN XL) 150 MG 24 hr tablet Take 150 mg by mouth daily.   levothyroxine (SYNTHROID, LEVOTHROID) 50 MCG tablet TK 1 T PO D (Patient not taking: Reported on 09/01/2021)   [DISCONTINUED] CHANTIX 1 MG tablet TK 1 T PO BID   [DISCONTINUED] cyclobenzaprine (FLEXERIL) 10 MG tablet Take 1 tablet (10 mg total) by mouth 2 (two) times daily as needed for muscle spasms.   [DISCONTINUED] escitalopram (LEXAPRO) 10 MG tablet Take 10 mg by mouth daily.   [DISCONTINUED] fluticasone (FLONASE) 50 MCG/ACT nasal spray Place 1-2 sprays into both nostrils daily.   [DISCONTINUED] ibuprofen (ADVIL) 600 MG tablet Take 1 tablet (600 mg total) by mouth every 6 (six) hours as needed.   [DISCONTINUED] lamoTRIgine (LAMICTAL) 25 MG tablet Take 3 tab daily (Patient not taking: Reported on 04/26/2019)   [DISCONTINUED] nitrofurantoin, macrocrystal-monohydrate, (MACROBID) 100 MG capsule Take 1 capsule (100 mg total) by mouth 2 (two) times daily.   [DISCONTINUED] predniSONE (DELTASONE) 20 MG tablet Take 2 tablets (40 mg total) by mouth daily.   No facility-administered encounter medications on file as of 09/01/2021.    Past  Medical History:  Diagnosis Date   Hypothyroidism    Obesity     Past Surgical History:  Procedure Laterality Date   CESAREAN SECTION     CHOLECYSTECTOMY     tonsilectomy     TUBAL LIGATION      Family History  Problem Relation Age of Onset   Diabetes Father    Diabetes Sister    Healthy Mother     Social History   Socioeconomic History   Marital status: Legally Separated    Spouse name: Not on file   Number of children: Not on file   Years of education: Not on file   Highest education level: Not on file  Occupational History   Not on file  Tobacco Use   Smoking status: Every Day    Packs/day: 0.25    Years: 13.00    Total pack years: 3.25    Types: Cigarettes   Smokeless tobacco: Never  Vaping Use   Vaping Use: Former   Devices: juul  Substance and Sexual Activity   Alcohol use: No   Drug use: No   Sexual activity: Not Currently    Birth control/protection: Surgical  Other Topics Concern   Not on file  Social History Narrative   Not on file   Social Determinants of Health   Financial Resource Strain: Not on file  Food Insecurity: Not on file  Transportation Needs: Not on file  Physical Activity: Not on file  Stress: Not  on file  Social Connections: Not on file  Intimate Partner Violence: Not on file    ROS Comprehensive ROS Pertinent positive and negative noted in HPI     Objective    BP 126/75   Pulse 70   Temp 97.6 F (36.4 C) (Oral)   Ht 5' 9.5" (1.765 m)   Wt 298 lb 6.4 oz (135.4 kg)   LMP 08/05/2021 (Approximate)   SpO2 99%   BMI 43.43 kg/m   Physical Exam Constitutional:      Appearance: She is obese.  HENT:     Head: Normocephalic.     Right Ear: Tympanic membrane and external ear normal.     Left Ear: Tympanic membrane and external ear normal.     Nose: Nose normal.  Eyes:     Extraocular Movements: Extraocular movements intact.     Conjunctiva/sclera: Conjunctivae normal.     Pupils: Pupils are equal, round, and  reactive to light.  Cardiovascular:     Rate and Rhythm: Normal rate and regular rhythm.  Pulmonary:     Effort: Pulmonary effort is normal.     Breath sounds: Normal breath sounds.  Abdominal:     General: Bowel sounds are normal. There is distension.     Palpations: Abdomen is soft.  Musculoskeletal:        General: Normal range of motion.     Cervical back: Normal range of motion and neck supple.  Skin:    General: Skin is warm and dry.  Neurological:     Mental Status: She is alert and oriented to person, place, and time.  Psychiatric:        Mood and Affect: Mood normal.        Behavior: Behavior normal.        Thought Content: Thought content normal.        Judgment: Judgment normal.      Assessment & Plan:  Abigail Rangel was seen today for new patient (initial visit).  Diagnoses and all orders for this visit:  Screening for diabetes mellitus -     HgB A1c 5.3 explained 5.7-6.4 prediabetes   Hypothyroidism, unspecified type -     TSH + free T4  Constipation, unspecified constipation type Only has 1 defecation a week . Information provided on AVS and prescribed Amitiza   Other orders -     lubiprostone (AMITIZA) 8 MCG capsule; Take 1 capsule (8 mcg total) by mouth 2 (two) times daily with a meal.  Anxiety and depression Chronic long hx stopped taking medication spiraling down -     CBC with Differential -     CMP14+EGFR -     traZODone (DESYREL) 50 MG tablet; Take 0.5-1 tablets (25-50 mg total) by mouth at bedtime as needed for sleep.  Hypothyroidism, unspecified type -     TSH + free T4  Insomnia, unspecified type -     traZODone (DESYREL) 50 MG tablet; Take 0.5-1 tablets (25-50 mg total) by mouth at bedtime as needed for sleep.  Vitamin D deficiency -     Vitamin D, 25-hydroxy  Lipid screening -     Lipid Panel  Other orders -     lubiprostone (AMITIZA) 8 MCG capsule; Take 1 capsule (8 mcg total) by mouth 2 (two) times daily with a meal.     Kerin Perna, NP

## 2021-09-01 NOTE — Patient Instructions (Signed)

## 2021-09-02 LAB — CMP14+EGFR
ALT: 39 IU/L — ABNORMAL HIGH (ref 0–32)
AST: 25 IU/L (ref 0–40)
Albumin/Globulin Ratio: 1.8 (ref 1.2–2.2)
Albumin: 4.7 g/dL (ref 3.8–4.8)
Alkaline Phosphatase: 75 IU/L (ref 44–121)
BUN/Creatinine Ratio: 13 (ref 9–23)
BUN: 9 mg/dL (ref 6–20)
Bilirubin Total: 0.5 mg/dL (ref 0.0–1.2)
CO2: 23 mmol/L (ref 20–29)
Calcium: 10 mg/dL (ref 8.7–10.2)
Chloride: 100 mmol/L (ref 96–106)
Creatinine, Ser: 0.7 mg/dL (ref 0.57–1.00)
Globulin, Total: 2.6 g/dL (ref 1.5–4.5)
Glucose: 92 mg/dL (ref 70–99)
Potassium: 4 mmol/L (ref 3.5–5.2)
Sodium: 139 mmol/L (ref 134–144)
Total Protein: 7.3 g/dL (ref 6.0–8.5)
eGFR: 114 mL/min/{1.73_m2} (ref >59)

## 2021-09-02 LAB — LIPID PANEL
Chol/HDL Ratio: 3.7 ratio (ref 0.0–4.4)
Cholesterol, Total: 139 mg/dL (ref 100–199)
HDL: 38 mg/dL — ABNORMAL LOW (ref >39)
LDL Chol Calc (NIH): 69 mg/dL (ref 0–99)
Triglycerides: 193 mg/dL — ABNORMAL HIGH (ref 0–149)
VLDL Cholesterol Cal: 32 mg/dL (ref 5–40)

## 2021-09-02 LAB — CBC WITH DIFFERENTIAL/PLATELET
Basophils Absolute: 0 10*3/uL (ref 0.0–0.2)
Basos: 1 %
EOS (ABSOLUTE): 0 10*3/uL (ref 0.0–0.4)
Eos: 1 %
Hematocrit: 39.1 % (ref 34.0–46.6)
Hemoglobin: 11.6 g/dL (ref 11.1–15.9)
Immature Grans (Abs): 0.1 10*3/uL (ref 0.0–0.1)
Immature Granulocytes: 1 %
Lymphocytes Absolute: 2.3 10*3/uL (ref 0.7–3.1)
Lymphs: 28 %
MCH: 20.6 pg — ABNORMAL LOW (ref 26.6–33.0)
MCHC: 29.7 g/dL — ABNORMAL LOW (ref 31.5–35.7)
MCV: 69 fL — ABNORMAL LOW (ref 79–97)
Monocytes Absolute: 0.6 10*3/uL (ref 0.1–0.9)
Monocytes: 8 %
Neutrophils Absolute: 5.1 10*3/uL (ref 1.4–7.0)
Neutrophils: 61 %
Platelets: 176 10*3/uL (ref 150–450)
RBC: 5.63 x10E6/uL — ABNORMAL HIGH (ref 3.77–5.28)
RDW: 17.3 % — ABNORMAL HIGH (ref 11.7–15.4)
WBC: 8.2 10*3/uL (ref 3.4–10.8)

## 2021-09-02 LAB — TSH+FREE T4
Free T4: 0.98 ng/dL (ref 0.82–1.77)
TSH: 9.52 u[IU]/mL — ABNORMAL HIGH (ref 0.450–4.500)

## 2021-09-02 LAB — VITAMIN D 25 HYDROXY (VIT D DEFICIENCY, FRACTURES): Vit D, 25-Hydroxy: 14.6 ng/mL — ABNORMAL LOW (ref 30.0–100.0)

## 2021-09-04 ENCOUNTER — Other Ambulatory Visit (INDEPENDENT_AMBULATORY_CARE_PROVIDER_SITE_OTHER): Payer: Self-pay | Admitting: Primary Care

## 2021-09-04 DIAGNOSIS — E039 Hypothyroidism, unspecified: Secondary | ICD-10-CM

## 2021-09-04 MED ORDER — LEVOTHYROXINE SODIUM 50 MCG PO TABS: 50.0000 ug | ORAL_TABLET | Freq: Every day | ORAL | 3 refills | Status: DC

## 2021-09-04 MED ORDER — ERGOCALCIFEROL 1.25 MG (50000 UT) PO CAPS: 50000.0000 [IU] | ORAL_CAPSULE | ORAL | 0 refills | Status: DC

## 2021-10-13 ENCOUNTER — Encounter (INDEPENDENT_AMBULATORY_CARE_PROVIDER_SITE_OTHER): Payer: Self-pay | Admitting: Primary Care

## 2021-10-13 ENCOUNTER — Ambulatory Visit (INDEPENDENT_AMBULATORY_CARE_PROVIDER_SITE_OTHER): Payer: BC Managed Care – PPO | Admitting: Primary Care

## 2021-10-13 VITALS — BP 112/75 | HR 57 | Temp 97.6°F | Ht 69.5 in | Wt 299.8 lb

## 2021-10-13 DIAGNOSIS — H1033 Unspecified acute conjunctivitis, bilateral: Secondary | ICD-10-CM | POA: Diagnosis not present

## 2021-10-13 DIAGNOSIS — E039 Hypothyroidism, unspecified: Secondary | ICD-10-CM | POA: Diagnosis not present

## 2021-10-13 MED ORDER — AZELASTINE HCL 0.05 % OP SOLN: 1.0000 [drp] | Freq: Two times a day (BID) | OPHTHALMIC | 2 refills | Status: DC

## 2021-10-13 NOTE — Progress Notes (Unsigned)
     [facility]        Subjective:    Abigail Rangel is a 36 y.o. female who presents for evaluation of blurred vision, discharge, erythema, pain, photophobia, and tearing in both eyes. She has noticed the above symptoms for 2 days. Onset was acute. Patient denies foreign body sensation, itching, and visual field deficit. There is a history of contact lens use.  The following portions of the patient's history were reviewed and updated as appropriate: allergies, current medications, past family history, past medical history, past social history, and past surgical history.  Review of Systems Pertinent items noted in HPI and remainder of comprehensive ROS otherwise negative.   Objective:    BP 112/75   Pulse (!) 57   Temp 97.6 F (36.4 C) (Oral)   Ht 5' 9.5" (1.765 m)   Wt 299 lb 12.8 oz (136 kg)   LMP 09/21/2021 (Approximate)   SpO2 97%   BMI 43.64 kg/m       General: No apparent distress. Eyes: light irritated more swollen and puffy through dark sun glasses. Neck: Supple, trachea midline. Thyroid: No enlargement, mobile without fixation, no tenderness. Cardiovascular: Regular rhythm and rate, no murmur, normal radial pulses. Respiratory: Normal respiratory effort, clear to auscultation. Gastrointestinal: Normal pitch active bowel sounds, nontender abdomen without distention or appreciable hepatomegaly. Skin: Appropriate warmth, no visible rash. Mental status: Alert, conversant, speech clear, thought logical, appropriate mood and affect, no hallucinations or delusions evident.  Assessment:  Zalaya was seen today for infection.  Diagnoses and all orders for this visit:  Acute bacterial conjunctivitis of both eyes Discussed the diagnosis and proper care of conjunctivitis.  Stressed household Presenter, broadcasting. School/daycare note written. Antibiotics per orders. Warm compress to eye(s). AZELASTINE HYDROCHLORIDE  Hypothyroidism, unspecified type -     TSH +  free T4  Other orders -     azelastine (OPTIVAR) 0.05 % ophthalmic solution; Place 1 drop into both eyes 2 (two) times daily.          This note has been created with Education officer, environmental. Any transcriptional errors are unintentional.   Grayce Sessions, NP 10/13/2021, 10:02 AM

## 2021-10-14 LAB — TSH+FREE T4
Free T4: 0.76 ng/dL — ABNORMAL LOW (ref 0.82–1.77)
TSH: 17.6 u[IU]/mL — ABNORMAL HIGH (ref 0.450–4.500)

## 2021-10-18 ENCOUNTER — Other Ambulatory Visit (INDEPENDENT_AMBULATORY_CARE_PROVIDER_SITE_OTHER): Payer: Self-pay | Admitting: Primary Care

## 2021-10-18 DIAGNOSIS — E039 Hypothyroidism, unspecified: Secondary | ICD-10-CM

## 2021-10-18 MED ORDER — LEVOTHYROXINE SODIUM 88 MCG PO TABS: 88.0000 ug | ORAL_TABLET | Freq: Every day | ORAL | 3 refills | Status: DC

## 2021-12-12 ENCOUNTER — Other Ambulatory Visit (INDEPENDENT_AMBULATORY_CARE_PROVIDER_SITE_OTHER): Payer: Self-pay | Admitting: Primary Care

## 2021-12-12 NOTE — Telephone Encounter (Signed)
Requested medication (s) are due for refill today: yes  Requested medication (s) are on the active medication list: yes  Last refill:  09/04/21 #12/0  Future visit scheduled: yes  Notes to clinic:  Unable to refill per protocol, cannot delegate.    Requested Prescriptions  Pending Prescriptions Disp Refills   Vitamin D, Ergocalciferol, (DRISDOL) 1.25 MG (50000 UNIT) CAPS capsule [Pharmacy Med Name: VITAMIN D2 50,000IU (ERGO) CAP RX] 12 capsule 0    Sig: TAKE 1 CAPSULE BY MOUTH 1 TIME A WEEK     Endocrinology:  Vitamins - Vitamin D Supplementation 2 Failed - 12/12/2021 12:29 PM      Failed - Manual Review: Route requests for 50,000 IU strength to the provider      Failed - Vitamin D in normal range and within 360 days    Vit D, 25-Hydroxy  Date Value Ref Range Status  09/01/2021 14.6 (L) 30.0 - 100.0 ng/mL Final    Comment:    Vitamin D deficiency has been defined by the Institute of Medicine and an Endocrine Society practice guideline as a level of serum 25-OH vitamin D less than 20 ng/mL (1,2). The Endocrine Society went on to further define vitamin D insufficiency as a level between 21 and 29 ng/mL (2). 1. IOM (Institute of Medicine). 2010. Dietary reference    intakes for calcium and D. [place]: The    [employer]. 2. Holick MF, Binkley [place], Bischoff-Ferrari HA, et al.    Evaluation, treatment, and prevention of vitamin D    deficiency: an Endocrine Society clinical practice    guideline. JCEM. 2011 Jul; 96(7):1911-30.          Passed - Ca in normal range and within 360 days    Calcium  Date Value Ref Range Status  09/01/2021 10.0 8.7 - 10.2 mg/dL Final         Passed - Valid encounter within last 12 months    Recent Outpatient Visits           2 months ago Acute bacterial conjunctivitis of both eyes   [place] Kerin Perna, NP   3 months ago Screening for diabetes mellitus   [place], Michelle P, NP       Future Appointments             Tomorrow Kerin Perna, NP [place]

## 2021-12-13 ENCOUNTER — Ambulatory Visit (INDEPENDENT_AMBULATORY_CARE_PROVIDER_SITE_OTHER): Payer: BC Managed Care – PPO | Admitting: Primary Care

## 2021-12-15 ENCOUNTER — Other Ambulatory Visit: Payer: Self-pay

## 2021-12-15 ENCOUNTER — Emergency Department (HOSPITAL_COMMUNITY): Payer: BC Managed Care – PPO

## 2021-12-15 ENCOUNTER — Emergency Department (HOSPITAL_COMMUNITY)
Admission: EM | Admit: 2021-12-15 | Discharge: 2021-12-16 | Disposition: A | Discharge status: Home / Self Care | Payer: BC Managed Care – PPO | Attending: Emergency Medicine | Admitting: Emergency Medicine

## 2021-12-15 ENCOUNTER — Encounter (HOSPITAL_COMMUNITY): Payer: Self-pay

## 2021-12-15 DIAGNOSIS — E039 Hypothyroidism, unspecified: Secondary | ICD-10-CM | POA: Diagnosis not present

## 2021-12-15 DIAGNOSIS — R221 Localized swelling, mass and lump, neck: Secondary | ICD-10-CM | POA: Diagnosis not present

## 2021-12-15 DIAGNOSIS — R252 Cramp and spasm: Secondary | ICD-10-CM | POA: Diagnosis not present

## 2021-12-15 DIAGNOSIS — R42 Dizziness and giddiness: Secondary | ICD-10-CM | POA: Diagnosis not present

## 2021-12-15 DIAGNOSIS — Z79899 Other long term (current) drug therapy: Secondary | ICD-10-CM | POA: Insufficient documentation

## 2021-12-15 DIAGNOSIS — R5383 Other fatigue: Secondary | ICD-10-CM | POA: Insufficient documentation

## 2021-12-15 DIAGNOSIS — E010 Iodine-deficiency related diffuse (endemic) goiter: Secondary | ICD-10-CM | POA: Diagnosis not present

## 2021-12-15 DIAGNOSIS — R609 Edema, unspecified: Secondary | ICD-10-CM | POA: Diagnosis not present

## 2021-12-15 DIAGNOSIS — M791 Myalgia, unspecified site: Secondary | ICD-10-CM | POA: Diagnosis not present

## 2021-12-15 DIAGNOSIS — R531 Weakness: Secondary | ICD-10-CM | POA: Diagnosis not present

## 2021-12-15 LAB — BASIC METABOLIC PANEL
Anion gap: 5 (ref 5–15)
BUN: 10 mg/dL (ref 6–20)
CO2: 28 mmol/L (ref 22–32)
Calcium: 9.5 mg/dL (ref 8.9–10.3)
Chloride: 104 mmol/L (ref 98–111)
Creatinine, Ser: 0.81 mg/dL (ref 0.44–1.00)
GFR, Estimated: >60 mL/min (ref >60)
Glucose, Bld: 103 mg/dL — ABNORMAL HIGH (ref 70–99)
Potassium: 4 mmol/L (ref 3.5–5.1)
Sodium: 137 mmol/L (ref 135–145)

## 2021-12-15 LAB — TSH: TSH: 75.176 u[IU]/mL — ABNORMAL HIGH (ref 0.350–4.500)

## 2021-12-15 LAB — CBC
HCT: 35.1 % — ABNORMAL LOW (ref 36.0–46.0)
Hemoglobin: 10.7 g/dL — ABNORMAL LOW (ref 12.0–15.0)
MCH: 20.5 pg — ABNORMAL LOW (ref 26.0–34.0)
MCHC: 30.5 g/dL (ref 30.0–36.0)
MCV: 67.1 fL — ABNORMAL LOW (ref 80.0–100.0)
Platelets: 174 10*3/uL (ref 150–400)
RBC: 5.23 MIL/uL — ABNORMAL HIGH (ref 3.87–5.11)
RDW: 16.1 % — ABNORMAL HIGH (ref 11.5–15.5)
WBC: 7.2 10*3/uL (ref 4.0–10.5)
nRBC: 0 % (ref 0.0–0.2)

## 2021-12-15 LAB — I-STAT BETA HCG BLOOD, ED (MC, WL, AP ONLY): I-stat hCG, quantitative: <5 m[IU]/mL (ref <5)

## 2021-12-15 LAB — MAGNESIUM: Magnesium: 2.1 mg/dL (ref 1.7–2.4)

## 2021-12-15 MED ORDER — ACETAMINOPHEN 500 MG PO TABS
1000.0000 mg | ORAL_TABLET | Freq: Once | ORAL | Status: AC
  Administered 2021-12-16: 1000 mg via ORAL
  Filled 2021-12-15: qty 2

## 2021-12-15 MED ORDER — IOHEXOL 300 MG/ML  SOLN
75.0000 mL | Freq: Once | INTRAMUSCULAR | Status: AC | PRN
  Administered 2021-12-15: 75 mL via INTRAVENOUS

## 2021-12-15 NOTE — ED Provider Triage Note (Signed)
Emergency Medicine Provider Triage Evaluation Note  Abigail Rangel , a 36 y.o. female  was evaluated in triage.  Pt complains of neck swelling over the last 1 to 2 days.  Denies recent trauma/injury.  States it feels as if she has something "stuck in her throat".  Denies difficulty swallowing, itching, fevers, chills, N/V, neck stiffness, or recent URI symptoms.  Also with some increased muscle spasms.  Hx of hypothyroidism.  Review of Systems  Positive:  Negative: See above  Physical Exam  BP 124/86   Pulse 78   Temp 98.4 F (36.9 C) (Oral)   Resp 20   Ht 5\' 10"  (1.778 m)   Wt 136.1 kg   LMP 12/07/2021   SpO2 97%   BMI 43.05 kg/m  Gen:   Awake, no distress   Resp:  Normal effort, equal chest rise,  MSK:   Moves extremities without difficulty  Other:  ABCs appear intact.  Able to swallow without difficulty.  Neck nontender, mild nontender small mass palpated left neck.  Oropharynx not erythematous without exudate.  Medical Decision Making  Medically screening exam initiated at 12:30 PM.  Appropriate orders placed.  Abigail Rangel was informed that the remainder of the evaluation will be completed by another provider, this initial triage assessment does not replace that evaluation, and the importance of remaining in the ED until their evaluation is complete.     Prince Rome, PA-C 42/70/62 1233

## 2021-12-15 NOTE — ED Provider Notes (Signed)
[facility]-EMERGENCY DEPT Provider Note   CSN: [id number] Arrival date & time: 12/15/21  1203     History  Chief Complaint  Patient presents with   neck swelling   muscle cramping   Dizziness    Abigail Rangel is a 36 y.o. female with hypothyroidism, bipolar 1, PTSD, history cholecystectomy, history of C-section x4 presents with neck swelling, muscle cramping.   Patient c/o cramping in strange body parts much more frequently than normal. Muscle cramping x a few weeks.  Noticed that her head and face are swollen for a couple of days. Noticed it when she lifted her arms above her head. She states that she has gained weight, approx 30 pounds since last summer. She just started her thyroid medicine, states maybe 15 pounds since then. She states that her TSH actually increased after starting the levothyroxine in July/August so she wonders if the dose was enough. Was diagnosed with hypothyroidism in 2019 but hadn't seen a doctor since. Confirmed in July by routine blood work. Has never had symptoms like this before. Also endorses myalgias, fatigued, generalized weakness.  She has missed quite a few doses of her levothyroxine. States she has only taken it a couple times in the last few weeks. Has difficulty finding a time to take it on an empty stomach.  Denies f/c, but she states she is cold all the time. Denies palpitations, chest pain, cough, SOB, abd pain, N/V/D, urinary symptoms. Takes stool softener for constipation. No recent illnesses, dental procedures/dental pain. No stridor or difficulty breathing.    Dizziness      Home Medications Prior to Admission medications   Medication Sig Start Date End Date Taking? Authorizing Provider  ergocalciferol (VITAMIN D2) 1.25 MG (50000 UT) capsule Take 1 capsule (50,000 Units total) by mouth once a week. 09/04/21  Yes Grayce Sessions, NP  levothyroxine (SYNTHROID) 50 MCG tablet Take 50 mcg by mouth daily. 12/13/21  Yes  [provider]  lubiprostone (AMITIZA) 8 MCG capsule Take 1 capsule (8 mcg total) by mouth 2 (two) times daily with a meal. 09/01/21  Yes Grayce Sessions, NP  azelastine (OPTIVAR) 0.05 % ophthalmic solution Place 1 drop into both eyes 2 (two) times daily. Patient not taking: Reported on 12/15/2021 10/13/21   Grayce Sessions, NP  traZODone (DESYREL) 50 MG tablet Take 0.5-1 tablets (25-50 mg total) by mouth at bedtime as needed for sleep. Patient not taking: Reported on 10/13/2021 09/01/21   Grayce Sessions, NP  lamoTRIgine (LAMICTAL) 25 MG tablet Take 3 tab daily Patient not taking: Reported on 04/26/2019 03/29/18 01/01/20  Cleotis Nipper, MD      Allergies    Amoxicillin    Review of Systems   Review of Systems  Neurological:  Positive for dizziness.   Review of systems positive for weight gain.  A 10 point review of systems was performed and is negative unless otherwise reported in HPI.  Physical Exam Updated Vital Signs BP (!) 149/111   Pulse 81   Temp 97.6 F (36.4 C) (Oral)   Resp 16   Ht 5\' 10"  (1.778 m)   Wt 136.1 kg   LMP 12/07/2021   SpO2 100%   BMI 43.05 kg/m  Physical Exam General: Normal appearing female, lying in bed.  HEENT: PERRLA, Sclera anicteric, MMM, trachea midline. Symmetric neck swelling obviously visible on exam. No erythema, palpable lymph nodes. Large palpable symmetric nontender thyroid. Clear oropharynx with no oropharyngeal swelling or sublingual/submandibular TTP. Uvula  is midline, tonsillar pillars wnl. Cardiology: RRR, no murmurs/rubs/gallops. BL radial and DP pulses equal bilaterally.  Resp: Normal respiratory rate and effort. CTAB, no wheezes, rhonchi, crackles.  Abd: Soft, non-tender, non-distended. No rebound tenderness or guarding.  GU: Deferred. MSK: No peripheral edema or signs of trauma. Extremities without deformity or TTP. No cyanosis or clubbing. Skin: warm, dry. No rashes or lesions. Neuro: A&Ox4, CNs II-XII grossly  intact. MAEs. Sensation grossly intact.  Psych: Normal mood and affect.   ED Results / Procedures / Treatments   Labs (all labs ordered are listed, but only abnormal results are displayed) Labs Reviewed  BASIC METABOLIC PANEL - Abnormal; Notable for the following components:      Result Value   Glucose, Bld 103 (*)    All other components within normal limits  CBC - Abnormal; Notable for the following components:   RBC 5.23 (*)    Hemoglobin 10.7 (*)    HCT 35.1 (*)    MCV 67.1 (*)    MCH 20.5 (*)    RDW 16.1 (*)    All other components within normal limits  TSH - Abnormal; Notable for the following components:   TSH 75.176 (*)    All other components within normal limits  T4, FREE  MAGNESIUM  I-STAT BETA HCG BLOOD, ED ([facility], [facility], [facility] ONLY)    EKG EKG Interpretation  Date/Time:  Friday December 15 2021 22:48:42 EDT Ventricular Rate:  64 PR Interval:  158 QRS Duration: 108 QT Interval:  427 QTC Calculation: 441 R Axis:   77 Text Interpretation: Sinus rhythm Low voltage, precordial leads Confirmed by Cindee Lame [phone]) on 12/15/2021 11:02:13 PM  Radiology No results found.  Procedures Procedures    Medications Ordered in ED Medications  iohexol (OMNIPAQUE) 300 MG/ML solution 75 mL (75 mLs Intravenous Contrast Given 12/15/21 2312)  acetaminophen (TYLENOL) tablet 1,000 mg (1,000 mg Oral Given 12/16/21 0000)    ED Course/ Medical Decision Making/ A&P                          Medical Decision Making Amount and/or Complexity of Data Reviewed Labs: ordered. Radiology: ordered. Decision-making details documented in ED Course.  Risk OTC drugs. Prescription drug management.    Patient is HDS, afebrile, slightly hypertensive. Sh is overall well-appearing, non-toxic.   Patient's symptoms including her facial/neck swelling, fatigue, myalgias, weight gain, cold feeling, all likely related to not taking her levothyroxine in s/o hypothyroidism. Indeed, TSH now 75, FT4  <0.25. Other labs demonstrate anemia 10.7, BL ~11.5, no recent bleeding. No leukocytosis, no electrolyte abnormalities. EKG is NSR at 64 bpm and newly low voltage, c/w hypothyroidism. She is intermittently more bradycardic into the 40s-50s, patient has no lightheadedness or CP, no hypotension.   CT soft tissue w/ contrast of the neck does demonstrate thyromegaly without inflammatory changes, no c/f acute thyroiditis, no thyroid nodules or neck masses. Her airway is stable with no c/f airway compromise, no stridor or difficulty breathing on exam.   I have personally reviewed and interpreted all labs and imaging.   Clinical Course as of 12/16/21 0014  Fri Dec 15, 2021  2355 CT Soft Tissue Neck W Contrast 1. Diffuse thyromegaly without discrete nodule or mass. No significant surrounding inflammatory changes by CT. 2. Otherwise negative CT of the neck. No acute inflammatory changes or other abnormality identified.  [HN]    Clinical Course User Index [HN] Audley Hose, MD    Patient  is informed that her constellation of symptoms is likely due to her not being compliant with her levothyroxine, all contributed to hypothyroidism.  Relayed to patient that it is very important that she be compliant with her levothyroxine daily and discussed ways with her by which she may take it on an empty stomach daily.  Relayed to patient that she needs to follow-up with her primary care physician to ensure that the dose of levothyroxine is appropriate for her with recheck of her levels.  Patient reports understanding and states that she will call in the morning to make an appointment. Patient is stable for discharge and o/p f/u.  Patient is discharged with discharge instructions and return precautions.  All questions answered to patient inspection.  Dispo: DC        Final Clinical Impression(s) / ED Diagnoses Final diagnoses:  Hypothyroidism, unspecified type    Rx / DC Orders ED Discharge Orders      None        This note was created using dictation software, which may contain spelling or grammatical errors.    Loetta Rough, MD 01/07/22 [phone]

## 2021-12-15 NOTE — Telephone Encounter (Signed)
Will forward to provider  

## 2021-12-15 NOTE — ED Triage Notes (Incomplete)
Per EMS- patient is coming from work. Patient reports that she has not been taking her thyroid medications as prescribed and is now having neck swelling, muscle cramping, and dizziness.

## 2021-12-15 NOTE — Discharge Instructions (Addendum)
Thank you for coming to [facility] Emergency Department. You were seen for fatigue, neck swelling, myalgias, weight gain. We did an exam, labs, and imaging, and these showed a very elevated thyroid stimulating hormone as well as thyromegaly, or a goiter. Your symptoms are a result of not taking your levothyroxine. It is very important to take your medications as prescribed. Please call and follow up with your primary care provider within 1 week.   Do not hesitate to return to the ED or call 911 if you experience: -Worsening symptoms -Chest pain, palpitations -Shortness of breath -Lightheadedness, passing out -Fevers/chills -Anything else that concerns you

## 2021-12-16 DIAGNOSIS — F4310 Post-traumatic stress disorder, unspecified: Secondary | ICD-10-CM | POA: Insufficient documentation

## 2021-12-16 DIAGNOSIS — E039 Hypothyroidism, unspecified: Secondary | ICD-10-CM | POA: Insufficient documentation

## 2021-12-16 LAB — T4, FREE: Free T4: <0.25 ng/dL — ABNORMAL LOW (ref 0.61–1.12)

## 2021-12-17 ENCOUNTER — Other Ambulatory Visit (INDEPENDENT_AMBULATORY_CARE_PROVIDER_SITE_OTHER): Payer: Self-pay | Admitting: Primary Care

## 2021-12-17 MED ORDER — VITAMIN D3 50 MCG (2000 UT) PO CAPS: 2000.0000 [IU] | ORAL_CAPSULE | Freq: Every day | ORAL | 1 refills | Status: DC

## 2021-12-28 ENCOUNTER — Encounter (INDEPENDENT_AMBULATORY_CARE_PROVIDER_SITE_OTHER): Payer: Self-pay | Admitting: Primary Care

## 2021-12-28 ENCOUNTER — Ambulatory Visit (INDEPENDENT_AMBULATORY_CARE_PROVIDER_SITE_OTHER): Payer: BC Managed Care – PPO | Admitting: Primary Care

## 2021-12-28 VITALS — BP 128/83 | HR 89 | Resp 16 | Wt 310.4 lb

## 2021-12-28 DIAGNOSIS — Z1159 Encounter for screening for other viral diseases: Secondary | ICD-10-CM

## 2021-12-28 DIAGNOSIS — F419 Anxiety disorder, unspecified: Secondary | ICD-10-CM | POA: Diagnosis not present

## 2021-12-28 DIAGNOSIS — Z114 Encounter for screening for human immunodeficiency virus [HIV]: Secondary | ICD-10-CM

## 2021-12-28 DIAGNOSIS — E039 Hypothyroidism, unspecified: Secondary | ICD-10-CM

## 2021-12-28 DIAGNOSIS — F32A Depression, unspecified: Secondary | ICD-10-CM

## 2021-12-28 DIAGNOSIS — Z9189 Other specified personal risk factors, not elsewhere classified: Secondary | ICD-10-CM

## 2021-12-28 MED ORDER — BUPROPION HCL ER (SR) 150 MG PO TB12: 150.0000 mg | ORAL_TABLET | Freq: Two times a day (BID) | ORAL | 1 refills | Status: DC

## 2021-12-28 MED ORDER — LEVOTHYROXINE SODIUM 88 MCG PO TABS: 88.0000 ug | ORAL_TABLET | Freq: Every day | ORAL | 0 refills | Status: DC

## 2021-12-28 NOTE — Patient Instructions (Signed)

## 2021-12-31 ENCOUNTER — Ambulatory Visit (HOSPITAL_COMMUNITY): Payer: Self-pay

## 2022-01-01 ENCOUNTER — Ambulatory Visit (HOSPITAL_COMMUNITY)
Admission: RE | Admit: 2022-01-01 | Discharge: 2022-01-01 | Disposition: A | Discharge status: Home / Self Care | Payer: BC Managed Care – PPO | Source: Ambulatory Visit | Attending: Internal Medicine | Admitting: Internal Medicine

## 2022-01-01 ENCOUNTER — Telehealth (HOSPITAL_COMMUNITY): Payer: Self-pay | Admitting: Emergency Medicine

## 2022-01-01 ENCOUNTER — Encounter (HOSPITAL_COMMUNITY): Payer: Self-pay

## 2022-01-01 ENCOUNTER — Other Ambulatory Visit: Payer: Self-pay

## 2022-01-01 VITALS — BP 123/81 | HR 96 | Temp 99.2°F | Resp 20

## 2022-01-01 DIAGNOSIS — J069 Acute upper respiratory infection, unspecified: Secondary | ICD-10-CM | POA: Diagnosis not present

## 2022-01-01 DIAGNOSIS — J111 Influenza due to unidentified influenza virus with other respiratory manifestations: Secondary | ICD-10-CM | POA: Diagnosis not present

## 2022-01-01 DIAGNOSIS — Z1152 Encounter for screening for COVID-19: Secondary | ICD-10-CM | POA: Diagnosis not present

## 2022-01-01 LAB — RESP PANEL BY RT-PCR (FLU A&B, COVID) ARPGX2
Influenza A by PCR: POSITIVE — AB
Influenza B by PCR: NEGATIVE
SARS Coronavirus 2 by RT PCR: NEGATIVE

## 2022-01-01 LAB — POC INFLUENZA A AND B ANTIGEN (URGENT CARE ONLY)
INFLUENZA A ANTIGEN, POC: NEGATIVE
INFLUENZA B ANTIGEN, POC: NEGATIVE

## 2022-01-01 MED ORDER — ACETAMINOPHEN 325 MG PO TABS
975.0000 mg | ORAL_TABLET | Freq: Once | ORAL | Status: AC
  Administered 2022-01-01: 975 mg via ORAL

## 2022-01-01 MED ORDER — BENZONATATE 100 MG PO CAPS: 100.0000 mg | ORAL_CAPSULE | Freq: Three times a day (TID) | ORAL | 0 refills | Status: DC

## 2022-01-01 MED ORDER — ACETAMINOPHEN 325 MG PO TABS
ORAL_TABLET | ORAL | Status: AC
  Filled 2022-01-01: qty 3

## 2022-01-01 MED ORDER — KETOROLAC TROMETHAMINE 30 MG/ML IJ SOLN
INTRAMUSCULAR | Status: AC
  Filled 2022-01-01: qty 1

## 2022-01-01 MED ORDER — OSELTAMIVIR PHOSPHATE 75 MG PO CAPS: 75.0000 mg | ORAL_CAPSULE | Freq: Two times a day (BID) | ORAL | 0 refills | Status: DC

## 2022-01-01 MED ORDER — KETOROLAC TROMETHAMINE 30 MG/ML IJ SOLN
30.0000 mg | Freq: Once | INTRAMUSCULAR | Status: AC
  Administered 2022-01-01: 30 mg via INTRAMUSCULAR

## 2022-01-01 NOTE — Discharge Instructions (Addendum)
You have a viral upper respiratory infection.  COVID-19 and flu PCR testing is pending. We will call you with results if positive. Flu testing in clinic was negative. If your COVID test is positive, you must stay at home until day 6 of symptoms. On day 6, you may go out into public and go back to work, but you must wear a mask until day 11 of symptoms to prevent spread to others.  Take tessalon pearles every 8 hours as needed for cough.  You may take tylenol 1,000mg  and ibuprofen 600mg  every 6 hours with food as needed for fever/chills, sore throat, aches/pains, and inflammation associated with viral illness. Take this with food to avoid stomach upset.    Continue taking dayquil and nyquil for symptom relief as needed.  We gave you a ketorolac injection in the clinic today to help with body aches. No aleve until tomorrow morning since we gave you the injection today.   You may do salt water and baking soda gargles every 4 hours as needed for your throat pain.  Please put 1 teaspoon of salt and 1/2 teaspoon of baking soda in 8 ounces of warm water then gargle and spit the water out. You may also put 1 tablespoon of honey in warm water and drink this to soothe your throat.  Place a humidifier in your room at night to help decrease dry air that can irritate your airway and cause you to have a sore throat and cough.  Please try to eat a well-balanced diet while you are sick so that your body gets proper nutrition to heal.  If you develop any new or worsening symptoms, please return.  If your symptoms are severe, please go to the emergency room.  Follow-up with your primary care provider for further evaluation and management of your symptoms as well as ongoing wellness visits.  I hope you feel better!

## 2022-01-01 NOTE — ED Triage Notes (Signed)
Pt reports since Friday she has had a fever,cough and body aches. Pt reports she feels like she has been run over by a truck.

## 2022-01-01 NOTE — ED Provider Notes (Signed)
[facility]    CSN: [id number] Arrival date & time: 01/01/22  6063      History   Chief Complaint Chief Complaint  Patient presents with   Influenza    Entered by patient   Cough   Generalized Body Aches   Fever    HPI Abigail Rangel is a 36 y.o. female.   Patient presents to urgent care for evaluation of sore throat, nasal congestion, body aches, fever/chills, headache, cough, fatigue that started 3 days ago on Saturday, December 30, 2021. Cough is dry and worse at nighttime. Nasal congestion is thick.  Sore throat is worsened with coughing.  Denies vision changes and dizziness. Reports chills but unknown highest temp at home due to lack of thermometer. Also reports mild nausea without vomiting.  Denies shortness of breath, chest pain, wheezing, dizziness, abdominal pain, diarrhea, and eye drainage.  No known sick contacts. Denies history of asthma or chronic respiratory problems.   Patient is not a smoker and denies drug use.  They are vaccinated against COVID-19 and they have not received their seasonal flu vaccine this year.  Has attempted use of DayQuil and NyQuil prior to arrival at urgent care for relief of symptoms without very much relief.  She has also been taking Aleve for body aches, last dose was last night.  Currently rates generalized body aches at an 8 on a scale of 0-10.    Influenza Presenting symptoms: cough and fever   Cough Associated symptoms: fever   Fever Associated symptoms: cough     Past Medical History:  Diagnosis Date   Hypothyroidism    Obesity     Patient Active Problem List   Diagnosis Date Noted   Hypothyroidism 12/16/2021   PTSD (post-traumatic stress disorder) 12/16/2021   Anxiety and depression 12/09/2017    Past Surgical History:  Procedure Laterality Date   CESAREAN SECTION     CHOLECYSTECTOMY     tonsilectomy     TUBAL LIGATION      OB History     Gravida  4   Para  4   Term  3   Preterm  1   AB  0    Living  0      SAB  0   IAB  0   Ectopic  0   Multiple  0   Live Births  0            Home Medications    Prior to Admission medications   Medication Sig Start Date End Date Taking? Authorizing Provider  benzonatate (TESSALON) 100 MG capsule Take 1 capsule (100 mg total) by mouth every 8 (eight) hours. 01/01/22  Yes Carlisle Beers, FNP  buPROPion (WELLBUTRIN SR) 150 MG 12 hr tablet Take 1 tablet (150 mg total) by mouth 2 (two) times daily. 12/28/21   Grayce Sessions, NP  Cholecalciferol (VITAMIN D3) 50 MCG (2000 UT) capsule Take 1 capsule (2,000 Units total) by mouth daily. 12/17/21   Grayce Sessions, NP  levothyroxine (SYNTHROID) 88 MCG tablet Take 1 tablet (88 mcg total) by mouth daily. 12/28/21   Grayce Sessions, NP  lubiprostone (AMITIZA) 8 MCG capsule Take 1 capsule (8 mcg total) by mouth 2 (two) times daily with a meal. 09/01/21   Grayce Sessions, NP    Family History Family History  Problem Relation Age of Onset   Diabetes Father    Diabetes Sister    Healthy Mother     Social  History Social History   Tobacco Use   Smoking status: Former    Packs/day: 0.25    Years: 13.00    Total pack years: 3.25    Types: Cigarettes   Smokeless tobacco: Never  Vaping Use   Vaping Use: Every day   Substances: Nicotine   Devices: juul  Substance Use Topics   Alcohol use: No   Drug use: No     Allergies   Amoxicillin   Review of Systems Review of Systems  Constitutional:  Positive for fever.  Respiratory:  Positive for cough.   Per HPI   Physical Exam Triage Vital Signs ED Triage Vitals  Enc Vitals Group     BP 01/01/22 0957 123/81     Pulse Rate 01/01/22 0957 96     Resp 01/01/22 0957 20     Temp 01/01/22 0957 99.2 F (37.3 C)     Temp src --      SpO2 01/01/22 0957 97 %     Weight --      Height --      Head Circumference --      Peak Flow --      Pain Score 01/01/22 0956 5     Pain Loc --      Pain Edu? --      Excl. in  [place]? --    No data found.  Updated Vital Signs BP 123/81   Pulse 96   Temp 99.2 F (37.3 C)   Resp 20   LMP 12/07/2021   SpO2 97%   Visual Acuity Right Eye Distance:   Left Eye Distance:   Bilateral Distance:    Right Eye Near:   Left Eye Near:    Bilateral Near:     Physical Exam Vitals and nursing note reviewed.  Constitutional:      Appearance: She is ill-appearing. She is not toxic-appearing.  HENT:     Head: Normocephalic and atraumatic.     Right Ear: Hearing, tympanic membrane, ear canal and external ear normal.     Left Ear: Hearing, tympanic membrane, ear canal and external ear normal.     Nose: Nose normal.     Mouth/Throat:     Lips: Pink.     Mouth: Mucous membranes are moist.     Tongue: No lesions.     Pharynx: Oropharynx is clear. Uvula midline. No pharyngeal swelling or posterior oropharyngeal erythema.     Tonsils: 0 on the right. 0 on the left.     Comments: Small amount of clear postnasal drainage visualized to the posterior oropharynx.  Eyes:     General: Lids are normal. Vision grossly intact. Gaze aligned appropriately.     Extraocular Movements: Extraocular movements intact.     Conjunctiva/sclera: Conjunctivae normal.     Pupils: Pupils are equal, round, and reactive to light.  Neck:     Trachea: Trachea and phonation normal.  Cardiovascular:     Rate and Rhythm: Normal rate and regular rhythm.     Heart sounds: Normal heart sounds, S1 normal and S2 normal.  Pulmonary:     Effort: Pulmonary effort is normal. No respiratory distress.     Breath sounds: Normal breath sounds and air entry.  Abdominal:     General: Abdomen is flat.     Palpations: Abdomen is soft.  Musculoskeletal:     Cervical back: Full passive range of motion without pain and neck supple.  Lymphadenopathy:     Cervical: Cervical  adenopathy present.  Skin:    General: Skin is warm and dry.     Capillary Refill: Capillary refill takes less than 2 seconds.     Findings:  No rash.  Neurological:     General: No focal deficit present.     Mental Status: She is alert and oriented to person, place, and time. Mental status is at baseline.     Cranial Nerves: No dysarthria or facial asymmetry.     Motor: No weakness.     Gait: Gait normal.  Psychiatric:        Mood and Affect: Mood normal.        Speech: Speech normal.        Behavior: Behavior normal.        Thought Content: Thought content normal.        Judgment: Judgment normal.      UC Treatments / Results  Labs (all labs ordered are listed, but only abnormal results are displayed) Labs Reviewed  RESP PANEL BY RT-PCR (FLU A&B, COVID) ARPGX2  POC INFLUENZA A AND B ANTIGEN (URGENT CARE ONLY)    EKG   Radiology No results found.  Procedures Procedures (including critical care time)  Medications Ordered in UC Medications  acetaminophen (TYLENOL) tablet 975 mg (975 mg Oral Given 01/01/22 1033)  ketorolac (TORADOL) 30 MG/ML injection 30 mg (30 mg Intramuscular Given 01/01/22 1033)    Initial Impression / Assessment and Plan / UC Course  I have reviewed the triage vital signs and the nursing notes.  Pertinent labs & imaging results that were available during my care of the patient were reviewed by me and considered in my medical decision making (see chart for details).  Viral URI with cough Symptoms and physical exam consistent with a viral upper respiratory tract infection that will likely resolve with rest, fluids, and prescriptions for symptomatic relief. No indication for imaging today based on stable cardiopulmonary exam and hemodynamically stable vital signs. POC influenza A and B testing is negative. COVID-19 and flu PCR testing is pending.  We will call patient if this is positive.  Quarantine guidelines discussed. Currently on day 3 of symptoms and does qualify for antiviral therapy.   Patient given ketorolac 30 mg and 975 mg Tylenol in clinic today for body aches which helped  significantly with pain.  Tessalon Perles sent to pharmacy for symptomatic relief to be taken as prescribed.   May continue use of Aleve/Tylenol over-the-counter for body aches, fever/chills, and overall discomfort associated with viral illness.  Next dose of Aleve may not be until tomorrow since she was given the ketorolac injection in clinic today.  May continue taking DayQuil and NyQuil for symptom relief.  Strict ED/urgent care return precautions given.  Patient verbalizes understanding and agreement with plan.  Counseled patient regarding possible side effects and uses of all medications prescribed at today's visit.  Patient verbalizes understanding and agreement with plan.  All questions answered.  Patient discharged from urgent care in stable condition.         Final Clinical Impressions(s) / UC Diagnoses   Final diagnoses:  Viral URI with cough  Influenza-like illness     Discharge Instructions      You have a viral upper respiratory infection.  COVID-19 and flu PCR testing is pending. We will call you with results if positive. Flu testing in clinic was negative. If your COVID test is positive, you must stay at home until day 6 of symptoms. On day 6,  you may go out into public and go back to work, but you must wear a mask until day 11 of symptoms to prevent spread to others.  Take tessalon pearles every 8 hours as needed for cough.  You may take tylenol 1,000mg  and ibuprofen 600mg  every 6 hours with food as needed for fever/chills, sore throat, aches/pains, and inflammation associated with viral illness. Take this with food to avoid stomach upset.    Continue taking dayquil and nyquil for symptom relief as needed.  We gave you a ketorolac injection in the clinic today to help with body aches. No aleve until tomorrow morning since we gave you the injection today.   You may do salt water and baking soda gargles every 4 hours as needed for your throat pain.  Please put 1  teaspoon of salt and 1/2 teaspoon of baking soda in 8 ounces of warm water then gargle and spit the water out. You may also put 1 tablespoon of honey in warm water and drink this to soothe your throat.  Place a humidifier in your room at night to help decrease dry air that can irritate your airway and cause you to have a sore throat and cough.  Please try to eat a well-balanced diet while you are sick so that your body gets proper nutrition to heal.  If you develop any new or worsening symptoms, please return.  If your symptoms are severe, please go to the emergency room.  Follow-up with your primary care provider for further evaluation and management of your symptoms as well as ongoing wellness visits.  I hope you feel better!      ED Prescriptions     Medication Sig Dispense Auth. Provider   benzonatate (TESSALON) 100 MG capsule Take 1 capsule (100 mg total) by mouth every 8 (eight) hours. 21 capsule , FNP      PDMP not reviewed this encounter.   Carlisle Beers, Carlisle Beers 01/01/22 [phone]

## 2022-01-04 NOTE — Progress Notes (Signed)
     [facility]        Subjective:     Abigail Rangel is a 36 y.o. female who presents for follow up of hypothyroidism. Current symptoms: none. Patient denies change in energy level, diarrhea, heat / cold intolerance, nervousness, palpitations, and weight changes. Symptoms have stabilized.  The following portions of the patient's history were reviewed and updated as appropriate: allergies, current medications, past family history, past medical history, past social history, past surgical history, and problem list.  Review of Systems Pertinent items noted in HPI and remainder of comprehensive ROS otherwise negative.    Objective:     BP 128/83   Pulse 89   Resp 16   Wt (!) 310 lb 6.4 oz (140.8 kg)   LMP 12/07/2021   SpO2 97%   BMI 44.54 kg/m  General appearance: alert, cooperative, appears stated age, and severe distress Head: Normocephalic, without obvious abnormality, atraumatic Eyes: conjunctivae/corneas clear. PERRL, EOM's intact. Fundi benign. Ears: normal TM's and external ear canals both ears Nose: Nares normal. Septum midline. Mucosa normal. No drainage or sinus tenderness. Neck: no adenopathy, no carotid bruit, no JVD, supple, symmetrical, trachea midline, and thyroid not enlarged, symmetric, no tenderness/mass/nodules Lungs: clear to auscultation bilaterally Heart: regular rate and rhythm, S1, S2 normal, no murmur, click, rub or gallop Abdomen: soft, non-tender; bowel sounds normal; no masses,  no organomegaly Extremities: extremities normal, atraumatic, no cyanosis or edema Skin: Skin color, texture, turgor normal. No rashes or lesions  Laboratory: Lab Results  Component Value Date   TSH 75.176 (H) 12/15/2021   TSH 17.600 (H) 10/13/2021    Assessment:  Abigail Rangel was seen today for hospitalization follow-up.  Diagnoses and all orders for this visit:   Hypothyroidism.  Replacement levothyroxine 88 mcg daily.     1. L-thyroxine per orders. 2.  Recheck thyroid function tests in 2 month. 3. Instructed not to take multivitamins or iron within 4 hours of taking thyroid medications. -     Ambulatory referral to Endocrinology  Anxiety and depression Wellbutrin she was initially taken once a day explained it was a twice daily medication and she will be taking it twice a day going for an follow-up with any problems or side effects Follow-up in 4 to 6 weeks for effectiveness of the medication  Encounter for HCV screening test for high risk patient -     HCV Ab w Reflex to Quant PCR; Future  Screening for HIV (human immunodeficiency virus) -     HIV Antibody (routine testing w rflx); Future  Other orders -     levothyroxine (SYNTHROID) 88 MCG tablet; Take 1 tablet (88 mcg total) by mouth daily. -     buPROPion (WELLBUTRIN SR) 150 MG 12 hr tablet; Take 1 tablet (150 mg total) by mouth 2 (two) times daily.    This note has been created with Education officer, environmental. Any transcriptional errors are unintentional.   Grayce Sessions, NP 01/07/2022, 9:38 PM

## 2022-01-25 ENCOUNTER — Ambulatory Visit (INDEPENDENT_AMBULATORY_CARE_PROVIDER_SITE_OTHER): Payer: BC Managed Care – PPO | Admitting: Primary Care

## 2022-02-02 ENCOUNTER — Encounter (HOSPITAL_COMMUNITY): Payer: Self-pay

## 2022-02-02 ENCOUNTER — Ambulatory Visit (HOSPITAL_COMMUNITY)
Admission: RE | Admit: 2022-02-02 | Discharge: 2022-02-02 | Disposition: A | Discharge status: Home / Self Care | Payer: BC Managed Care – PPO | Source: Ambulatory Visit | Attending: Nurse Practitioner | Admitting: Nurse Practitioner

## 2022-02-02 VITALS — BP 122/79 | HR 85 | Temp 98.2°F | Resp 18

## 2022-02-02 DIAGNOSIS — B349 Viral infection, unspecified: Secondary | ICD-10-CM | POA: Diagnosis not present

## 2022-02-02 DIAGNOSIS — R519 Headache, unspecified: Secondary | ICD-10-CM | POA: Diagnosis not present

## 2022-02-02 DIAGNOSIS — B9789 Other viral agents as the cause of diseases classified elsewhere: Secondary | ICD-10-CM | POA: Diagnosis not present

## 2022-02-02 DIAGNOSIS — J069 Acute upper respiratory infection, unspecified: Secondary | ICD-10-CM | POA: Insufficient documentation

## 2022-02-02 DIAGNOSIS — R059 Cough, unspecified: Secondary | ICD-10-CM | POA: Diagnosis not present

## 2022-02-02 DIAGNOSIS — Z1152 Encounter for screening for COVID-19: Secondary | ICD-10-CM | POA: Diagnosis not present

## 2022-02-02 LAB — RESP PANEL BY RT-PCR (FLU A&B, COVID) ARPGX2
Influenza A by PCR: NEGATIVE
Influenza B by PCR: NEGATIVE
SARS Coronavirus 2 by RT PCR: NEGATIVE

## 2022-02-02 NOTE — Discharge Instructions (Signed)
Rest and fluids The clinic will contact you with results of the swab done today are positive Continue cough medication as needed Follow-up with PCP 2 to 3 days for recheck Please go to the emergency room if you have any worsening symptoms I hope you feel better soon examination point

## 2022-02-02 NOTE — ED Provider Notes (Signed)
[facility]    CSN: [id number] Arrival date & time: 02/02/22  1824      History   Chief Complaint Chief Complaint  Patient presents with   Cough    Covid/flu/cold symptoms - Entered by patient   Nasal Congestion   Headache    HPI Abigail Rangel is a 36 y.o. female presents for evaluation of cough.  Patient reports 3 days of a cough with congestion, body aches, headache, loss of taste, and subjective fevers.  Any nausea/vomiting/diarrhea, ear pain, sore throat, shortness of breath.  No asthma history. She does vape currently.  She is vaccinated for COVID, not up-to-date on her flu vaccine.  Reports her children have similar symptoms.  She has been taking DayQuil and NyQuil with minimal relief.  No other concerns at this time.   Cough Associated symptoms: fever, headaches and myalgias   Headache Associated symptoms: congestion, cough, fever and myalgias     Past Medical History:  Diagnosis Date   Hypothyroidism    Obesity     Patient Active Problem List   Diagnosis Date Noted   Hypothyroidism 12/16/2021   PTSD (post-traumatic stress disorder) 12/16/2021   Anxiety and depression 12/09/2017    Past Surgical History:  Procedure Laterality Date   CESAREAN SECTION     CHOLECYSTECTOMY     tonsilectomy     TUBAL LIGATION      OB History     Gravida  4   Para  4   Term  3   Preterm  1   AB  0   Living  0      SAB  0   IAB  0   Ectopic  0   Multiple  0   Live Births  0            Home Medications    Prior to Admission medications   Medication Sig Start Date End Date Taking? Authorizing Provider  buPROPion (WELLBUTRIN SR) 150 MG 12 hr tablet Take 1 tablet (150 mg total) by mouth 2 (two) times daily. 12/28/21  Yes Grayce Sessions, NP  Cholecalciferol (VITAMIN D3) 50 MCG (2000 UT) capsule Take 1 capsule (2,000 Units total) by mouth daily. 12/17/21  Yes Grayce Sessions, NP  levothyroxine (SYNTHROID) 88 MCG tablet Take 1 tablet  (88 mcg total) by mouth daily. 12/28/21  Yes Grayce Sessions, NP  lubiprostone (AMITIZA) 8 MCG capsule Take 1 capsule (8 mcg total) by mouth 2 (two) times daily with a meal. 09/01/21  Yes Grayce Sessions, NP  benzonatate (TESSALON) 100 MG capsule Take 1 capsule (100 mg total) by mouth every 8 (eight) hours. 01/01/22   Carlisle Beers, FNP  oseltamivir (TAMIFLU) 75 MG capsule Take 1 capsule (75 mg total) by mouth every 12 (twelve) hours. 01/01/22   Carlisle Beers, FNP    Family History Family History  Problem Relation Age of Onset   Diabetes Father    Diabetes Sister    Healthy Mother     Social History Social History   Tobacco Use   Smoking status: Former    Packs/day: 0.25    Years: 13.00    Total pack years: 3.25    Types: Cigarettes   Smokeless tobacco: Never  Vaping Use   Vaping Use: Every day   Substances: Nicotine   Devices: juul  Substance Use Topics   Alcohol use: No   Drug use: No     Allergies   Amoxicillin  Review of Systems Review of Systems  Constitutional:  Positive for fever.  HENT:  Positive for congestion.        Loss of taste  Respiratory:  Positive for cough.   Musculoskeletal:  Positive for myalgias.  Neurological:  Positive for headaches.     Physical Exam Triage Vital Signs ED Triage Vitals  Enc Vitals Group     BP 02/02/22 1859 122/79     Pulse Rate 02/02/22 1859 85     Resp 02/02/22 1859 18     Temp 02/02/22 1859 98.2 F (36.8 C)     Temp Source 02/02/22 1859 Oral     SpO2 02/02/22 1859 96 %     Weight --      Height --      Head Circumference --      Peak Flow --      Pain Score 02/02/22 1858 5     Pain Loc --      Pain Edu? --      Excl. in [place]? --    No data found.  Updated Vital Signs BP 122/79 (BP Location: Right Arm)   Pulse 85   Temp 98.2 F (36.8 C) (Oral)   Resp 18   LMP 01/16/2022 (Approximate)   SpO2 96%   Visual Acuity Right Eye Distance:   Left Eye Distance:   Bilateral Distance:     Right Eye Near:   Left Eye Near:    Bilateral Near:     Physical Exam Vitals and nursing note reviewed.  Constitutional:      General: She is not in acute distress.    Appearance: She is well-developed. She is not ill-appearing.  HENT:     Head: Normocephalic and atraumatic.     Right Ear: Tympanic membrane and ear canal normal.     Left Ear: Tympanic membrane and ear canal normal.     Nose: Congestion present.     Mouth/Throat:     Mouth: Mucous membranes are moist.     Pharynx: Oropharynx is clear. Uvula midline. Posterior oropharyngeal erythema present.     Tonsils: No tonsillar exudate or tonsillar abscesses.  Eyes:     Conjunctiva/sclera: Conjunctivae normal.     Pupils: Pupils are equal, round, and reactive to light.  Cardiovascular:     Rate and Rhythm: Normal rate and regular rhythm.     Heart sounds: Normal heart sounds.  Pulmonary:     Effort: Pulmonary effort is normal.     Breath sounds: Normal breath sounds.  Musculoskeletal:     Cervical back: Normal range of motion and neck supple.  Lymphadenopathy:     Cervical: No cervical adenopathy.  Skin:    General: Skin is warm and dry.  Neurological:     General: No focal deficit present.     Mental Status: She is alert and oriented to person, place, and time.  Psychiatric:        Mood and Affect: Mood normal.        Behavior: Behavior normal.      UC Treatments / Results  Labs (all labs ordered are listed, but only abnormal results are displayed) Labs Reviewed  RESP PANEL BY RT-PCR (FLU A&B, COVID) [id number]    EKG   Radiology No results found.  Procedures Procedures (including critical care time)  Medications Ordered in UC Medications - No data to display  Initial Impression / Assessment and Plan / UC Course  I have reviewed the triage vital  signs and the nursing notes.  Pertinent labs & imaging results that were available during my care of the patient were reviewed by me and considered in my  medical decision making (see chart for details).     Reviewed exam and symptoms with patient.  No red flags on exam COVID PCR and flu and will contact if positive.  Patient would be candidate for Paxlovid as long as no medication interactions noted Discussed viral illness and symptomatic treatment Rest and fluids Follow-up with PCP 2 to 3 days recheck ER precautions reviewed and patient verbalized understanding Final Clinical Impressions(s) / UC Diagnoses   Final diagnoses:  Viral illness  Acute upper respiratory infection     Discharge Instructions      Rest and fluids The clinic will contact you with results of the swab done today are positive Continue cough medication as needed Follow-up with PCP 2 to 3 days for recheck Please go to the emergency room if you have any worsening symptoms I hope you feel better soon examination point    ED Prescriptions   None    PDMP not reviewed this encounter.   Radford Pax, NP 02/02/22 [phone]

## 2022-02-02 NOTE — ED Triage Notes (Signed)
Pt states headache, body aches, cough, congestion that started Wednesday. She has been taking dayquil and nyquil without some relief. She has lost sense of taste.

## 2022-04-07 ENCOUNTER — Encounter (HOSPITAL_COMMUNITY): Payer: Self-pay

## 2022-04-07 ENCOUNTER — Ambulatory Visit (HOSPITAL_COMMUNITY)
Admission: RE | Admit: 2022-04-07 | Discharge: 2022-04-07 | Disposition: A | Discharge status: Home / Self Care | Payer: BC Managed Care – PPO | Source: Ambulatory Visit | Attending: Physician Assistant | Admitting: Physician Assistant

## 2022-04-07 VITALS — BP 113/79 | HR 80 | Temp 98.3°F | Resp 15

## 2022-04-07 DIAGNOSIS — H66001 Acute suppurative otitis media without spontaneous rupture of ear drum, right ear: Secondary | ICD-10-CM | POA: Diagnosis not present

## 2022-04-07 MED ORDER — AZITHROMYCIN 250 MG PO TABS: 250.0000 mg | ORAL_TABLET | Freq: Every day | ORAL | 0 refills | Status: DC

## 2022-04-07 NOTE — ED Triage Notes (Signed)
Pt reports right feels clogged and painful that started yesterday. Also having sinus headache, congestion, runny nose.

## 2022-04-07 NOTE — ED Provider Notes (Signed)
[facility]    CSN: [id number] Arrival date & time: 04/07/22  1501      History   Chief Complaint Chief Complaint  Patient presents with   Ear Fullness    SEEMS LIKE A SINUS INFECTION WITH EAR PAIN - Entered by patient   appt 3    HPI Arnel Hamblet is a 37 y.o. female.   Patient presents today with a 3 to 4-day history of URI symptoms.  Over the past few days she has developed fullness and discomfort in her right ear.  Pain is rated 3 on a 0-10 pain scale, localized to right ear, described as pressure/throbbing, no aggravating relieving factors identified.  She has tried multiple over-the-counter medication including DayQuil, NyQuil, Profen without improvement of symptoms.  Denies any fever, nausea, vomiting, chest pain, shortness of breath.  She has had COVID in the past with last episode approximately a year ago.  She has not had most recent COVID-19 vaccination or influenza vaccine.  Denies any recent antibiotics.      Past Medical History:  Diagnosis Date   Hypothyroidism    Obesity     Patient Active Problem List   Diagnosis Date Noted   Hypothyroidism 12/16/2021   PTSD (post-traumatic stress disorder) 12/16/2021   Anxiety and depression 12/09/2017    Past Surgical History:  Procedure Laterality Date   CESAREAN SECTION     CHOLECYSTECTOMY     tonsilectomy     TUBAL LIGATION      OB History     Gravida  4   Para  4   Term  3   Preterm  1   AB  0   Living  0      SAB  0   IAB  0   Ectopic  0   Multiple  0   Live Births  0            Home Medications    Prior to Admission medications   Medication Sig Start Date End Date Taking? Authorizing Provider  azithromycin (ZITHROMAX) 250 MG tablet Take 1 tablet (250 mg total) by mouth daily. Take first 2 tablets together, then 1 every day until finished. 04/07/22  Yes Lashunta Frieden K, PA-C  buPROPion (WELLBUTRIN SR) 150 MG 12 hr tablet Take 1 tablet (150 mg total) by mouth 2 (two)  times daily. 12/28/21   Kerin Perna, NP  levothyroxine (SYNTHROID) 88 MCG tablet Take 1 tablet (88 mcg total) by mouth daily. 12/28/21   Kerin Perna, NP    Family History Family History  Problem Relation Age of Onset   Diabetes Father    Diabetes Sister    Healthy Mother     Social History Social History   Tobacco Use   Smoking status: Former    Packs/day: 0.25    Years: 13.00    Total pack years: 3.25    Types: Cigarettes   Smokeless tobacco: Never  Vaping Use   Vaping Use: Every day   Substances: Nicotine   Devices: juul  Substance Use Topics   Alcohol use: No   Drug use: No     Allergies   Amoxicillin   Review of Systems Review of Systems  Constitutional:  Positive for activity change. Negative for appetite change, fatigue and fever.  HENT:  Positive for congestion, ear pain, hearing loss, postnasal drip, sinus pressure and sore throat. Negative for sneezing.   Respiratory:  Positive for cough. Negative for shortness of breath.  Cardiovascular:  Negative for chest pain.  Gastrointestinal:  Negative for abdominal pain, diarrhea, nausea and vomiting.  Musculoskeletal:  Negative for arthralgias and myalgias.  Neurological:  Negative for dizziness, light-headedness and headaches.     Physical Exam Triage Vital Signs ED Triage Vitals  Enc Vitals Group     BP 04/07/22 1544 113/79     Pulse Rate 04/07/22 1544 80     Resp 04/07/22 1544 15     Temp 04/07/22 1544 98.3 F (36.8 C)     Temp Source 04/07/22 1544 Oral     SpO2 04/07/22 1544 96 %     Weight --      Height --      Head Circumference --      Peak Flow --      Pain Score 04/07/22 1542 3     Pain Loc --      Pain Edu? --      Excl. in [place]? --    No data found.  Updated Vital Signs BP 113/79 (BP Location: Left Arm)   Pulse 80   Temp 98.3 F (36.8 C) (Oral)   Resp 15   LMP 03/29/2022   SpO2 96%   Visual Acuity Right Eye Distance:   Left Eye Distance:   Bilateral Distance:     Right Eye Near:   Left Eye Near:    Bilateral Near:     Physical Exam Vitals reviewed.  Constitutional:      General: She is awake. She is not in acute distress.    Appearance: Normal appearance. She is well-developed. She is not ill-appearing.     Comments: Very pleasant female appears stated age in no acute distress sitting comfortably in exam room  HENT:     Head: Normocephalic and atraumatic.     Right Ear: Ear canal and external ear normal. Tympanic membrane is erythematous and bulging.     Left Ear: Tympanic membrane, ear canal and external ear normal. Tympanic membrane is not erythematous or bulging.     Nose:     Right Sinus: No maxillary sinus tenderness or frontal sinus tenderness.     Left Sinus: No maxillary sinus tenderness or frontal sinus tenderness.     Mouth/Throat:     Pharynx: Uvula midline. Posterior oropharyngeal erythema present. No oropharyngeal exudate.  Cardiovascular:     Rate and Rhythm: Normal rate and regular rhythm.     Heart sounds: Normal heart sounds, S1 normal and S2 normal. No murmur heard. Pulmonary:     Effort: Pulmonary effort is normal.     Breath sounds: Normal breath sounds. No wheezing, rhonchi or rales.     Comments: Clear to auscultation bilaterally Psychiatric:        Behavior: Behavior is cooperative.      UC Treatments / Results  Labs (all labs ordered are listed, but only abnormal results are displayed) Labs Reviewed - No data to display  EKG   Radiology No results found.  Procedures Procedures (including critical care time)  Medications Ordered in UC Medications - No data to display  Initial Impression / Assessment and Plan / UC Course  I have reviewed the triage vital signs and the nursing notes.  Pertinent labs & imaging results that were available during my care of the patient were reviewed by me and considered in my medical decision making (see chart for details).     Otitis media was in a final cycle  exam.  She was started on  azithromycin given history of hives with amoxicillin.  Discussed that congestion symptoms are likely better to viral illness and offered COVID/flu testing but patient reports that current symptoms are not similar to previous episodes of this condition and declined this today.  Recommend that she use over-the-counter medication such as cetirizine and Flonase to help with her symptoms.  Discussed that if her symptoms are improving quickly she should follow-up with ENT for further evaluation and was given contact information for local provider with instruction to call to schedule an appointment as needed.  If she has any worsening or changing symptoms including high fever, worsening cough, shortness of breath, nausea/vomiting interfere with oral intake, otorrhea she should be seen immediately.  Strict return precautions given.  Work excuse note provided.  Final Clinical Impressions(s) / UC Diagnoses   Final diagnoses:  Non-recurrent acute suppurative otitis media of right ear without spontaneous rupture of tympanic membrane     Discharge Instructions      You have an ear infection.  Take azithromycin as prescribed.  This should help with the pain and feeling of fullness but it can take a while for you to have a normal hearing from this year.  Recommend regular use of allergy medication to encourage improvement in the symptoms.  If your symptoms or not improving please follow-up with ENT.  If anything worsens and you have increasing pain, fever, nausea, vomiting, drainage from the ear you should be seen immediately.     ED Prescriptions     Medication Sig Dispense Auth. Provider   azithromycin (ZITHROMAX) 250 MG tablet Take 1 tablet (250 mg total) by mouth daily. Take first 2 tablets together, then 1 every day until finished. 6 tablet Rudransh Bellanca, Derry Skill, PA-C      PDMP not reviewed this encounter.   Terrilee Croak, PA-C 04/07/22 1559

## 2022-04-07 NOTE — Discharge Instructions (Signed)
You have an ear infection.  Take azithromycin as prescribed.  This should help with the pain and feeling of fullness but it can take a while for you to have a normal hearing from this year.  Recommend regular use of allergy medication to encourage improvement in the symptoms.  If your symptoms or not improving please follow-up with ENT.  If anything worsens and you have increasing pain, fever, nausea, vomiting, drainage from the ear you should be seen immediately.

## 2022-05-24 ENCOUNTER — Ambulatory Visit (HOSPITAL_COMMUNITY): Payer: BC Managed Care – PPO

## 2022-05-24 ENCOUNTER — Ambulatory Visit (HOSPITAL_COMMUNITY)
Admission: EM | Admit: 2022-05-24 | Discharge: 2022-05-24 | Disposition: A | Discharge status: Home / Self Care | Payer: BC Managed Care – PPO | Attending: Family Medicine | Admitting: Family Medicine

## 2022-05-24 ENCOUNTER — Inpatient Hospital Stay (HOSPITAL_COMMUNITY): Admission: RE | Admit: 2022-05-24 | Payer: BC Managed Care – PPO | Source: Ambulatory Visit

## 2022-05-24 ENCOUNTER — Encounter (HOSPITAL_COMMUNITY): Payer: Self-pay | Admitting: *Deleted

## 2022-05-24 DIAGNOSIS — R5383 Other fatigue: Secondary | ICD-10-CM | POA: Diagnosis not present

## 2022-05-24 DIAGNOSIS — Z833 Family history of diabetes mellitus: Secondary | ICD-10-CM | POA: Insufficient documentation

## 2022-05-24 DIAGNOSIS — R1084 Generalized abdominal pain: Secondary | ICD-10-CM | POA: Diagnosis not present

## 2022-05-24 DIAGNOSIS — E039 Hypothyroidism, unspecified: Secondary | ICD-10-CM

## 2022-05-24 DIAGNOSIS — R350 Frequency of micturition: Secondary | ICD-10-CM | POA: Insufficient documentation

## 2022-05-24 LAB — TSH: TSH: 43.332 u[IU]/mL — ABNORMAL HIGH (ref 0.350–4.500)

## 2022-05-24 LAB — CBG MONITORING, ED: Glucose-Capillary: 96 mg/dL (ref 70–99)

## 2022-05-24 LAB — CBC
HCT: 35.3 % — ABNORMAL LOW (ref 36.0–46.0)
Hemoglobin: 11 g/dL — ABNORMAL LOW (ref 12.0–15.0)
MCH: 20.7 pg — ABNORMAL LOW (ref 26.0–34.0)
MCHC: 31.2 g/dL (ref 30.0–36.0)
MCV: 66.4 fL — ABNORMAL LOW (ref 80.0–100.0)
Platelets: 173 10*3/uL (ref 150–400)
RBC: 5.32 MIL/uL — ABNORMAL HIGH (ref 3.87–5.11)
RDW: 16.1 % — ABNORMAL HIGH (ref 11.5–15.5)
WBC: 8 10*3/uL (ref 4.0–10.5)
nRBC: 0 % (ref 0.0–0.2)

## 2022-05-24 LAB — POCT URINALYSIS DIPSTICK, ED / UC
Bilirubin Urine: NEGATIVE
Glucose, UA: NEGATIVE mg/dL
Ketones, ur: NEGATIVE mg/dL
Leukocytes,Ua: NEGATIVE
Nitrite: NEGATIVE
Protein, ur: NEGATIVE mg/dL
Specific Gravity, Urine: >=1.03 (ref 1.005–1.030)
Urobilinogen, UA: 1 mg/dL (ref 0.0–1.0)
pH: 5.5 (ref 5.0–8.0)

## 2022-05-24 LAB — COMPREHENSIVE METABOLIC PANEL
ALT: 29 U/L (ref 0–44)
AST: 20 U/L (ref 15–41)
Albumin: 3.8 g/dL (ref 3.5–5.0)
Alkaline Phosphatase: 65 U/L (ref 38–126)
Anion gap: 9 (ref 5–15)
BUN: 9 mg/dL (ref 6–20)
CO2: 23 mmol/L (ref 22–32)
Calcium: 9.2 mg/dL (ref 8.9–10.3)
Chloride: 105 mmol/L (ref 98–111)
Creatinine, Ser: 0.69 mg/dL (ref 0.44–1.00)
GFR, Estimated: >60 mL/min (ref >60)
Glucose, Bld: 97 mg/dL (ref 70–99)
Potassium: 3.9 mmol/L (ref 3.5–5.1)
Sodium: 137 mmol/L (ref 135–145)
Total Bilirubin: 0.6 mg/dL (ref 0.3–1.2)
Total Protein: 7.7 g/dL (ref 6.5–8.1)

## 2022-05-24 MED ORDER — ONDANSETRON 4 MG PO TBDP: 4.0000 mg | ORAL_TABLET | Freq: Three times a day (TID) | ORAL | 0 refills | Status: DC | PRN

## 2022-05-24 NOTE — ED Notes (Signed)
Patient declined to obtain a urine specimen.

## 2022-05-24 NOTE — Discharge Instructions (Signed)
You have had labs (blood tests) sent today. We will call you with any significant abnormalities or if there is need to begin or change treatment or pursue further follow up.  You may also review your test results online through MyChart. If you do not have a MyChart account, instructions to sign up should be on your discharge paperwork.  

## 2022-05-24 NOTE — ED Triage Notes (Signed)
Pt states last night she developed extreme abdominal pain, she is normally constipated. She states he had to strain so hard to have the BM then it was liquid stool behind it. Since she has had abdominal pain and cramping.    She feels fatigued X 1 month she states that she is sleeping all day can't seem to stay awake. She has been on thyroid meds and antidepressants but hasn't been on them for about 3 months.

## 2022-05-25 ENCOUNTER — Telehealth (HOSPITAL_COMMUNITY): Payer: Self-pay | Admitting: Emergency Medicine

## 2022-05-25 MED ORDER — LEVOTHYROXINE SODIUM 88 MCG PO TABS: 88.0000 ug | ORAL_TABLET | Freq: Every day | ORAL | 0 refills | Status: DC

## 2022-05-26 LAB — HEMOGLOBIN A1C
Hgb A1c MFr Bld: 5.7 % — ABNORMAL HIGH (ref 4.8–5.6)
Mean Plasma Glucose: 117 mg/dL

## 2022-06-04 NOTE — ED Provider Notes (Signed)
[facility]   [id number] 05/24/22 Arrival Time: 1802  ASSESSMENT & PLAN:  1. Generalized abdominal pain   2. Other fatigue   3. Urinary frequency   4. Hypothyroidism, unspecified type    I suspect we will see that hypothyroidism may be playing a large role in current symptoms.  Benign abdominal exam. No indications for urgent abdominal/pelvic imaging at this time. Agrees to ED eval should abd pain worsen in any way. Rec f/u with PCP.  As needed: Meds ordered this encounter  Medications   ondansetron (ZOFRAN-ODT) 4 MG disintegrating tablet    Sig: Take 1 tablet (4 mg total) by mouth every 8 (eight) hours as needed for nausea or vomiting.    Dispense:  15 tablet    Refill:  0  Is tolerating PO fluids.  Labs pending:   Discharge Instructions      You have had labs (blood tests) sent today. We will call you with any significant abnormalities or if there is need to begin or change treatment or pursue further follow up.  You may also review your test results online through MyChart. If you do not have a MyChart account, instructions to sign up should be on your discharge paperwork.     Follow-up Information     Schedule an appointment as soon as possible for a visit  with Grayce Sessions, NP.   Specialty: Internal Medicine Contact information: [address] [place] [address] [phone]                 Reviewed expectations re: course of current medical issues. Questions answered. Outlined signs and symptoms indicating need for more acute intervention. Patient verbalized understanding. After Visit Summary given.   SUBJECTIVE: History from: patient. Abigail Rangel is a 37 y.o. female who reports abd pain last evening; feels constipated and very tired. H/O hypothyriodism. Hasn't been on medications. Abd pain better today but feels cramp-like. Mild nausea without emesis. Tolerating PO intake. Denies fever. Normal ambulation. Ques some  urinary frequency. Patient's last menstrual period was 05/03/2022 (approximate). Past Surgical History:  Procedure Laterality Date   CESAREAN SECTION     CHOLECYSTECTOMY     tonsilectomy     TUBAL LIGATION       OBJECTIVE:  Vitals:   05/24/22 1851  BP: 126/82  Pulse: 71  Resp: 18  Temp: 97.8 F (36.6 C)  TempSrc: Oral  SpO2: 97%    General appearance: alert, oriented, no acute distress but appears fatigued HEENT: [place]; AT; oropharynx moist Lungs: unlabored respirations Abdomen: soft; without distention; no specific tenderness to palpation; normal bowel sounds; without masses or organomegaly; without guarding or rebound tenderness Back: without reported CVA tenderness; FROM at waist Extremities: without LE edema; symmetrical; without gross deformities Skin: warm and dry Neurologic: normal gait Psychological: alert and cooperative; normal mood and affect  Allergies  Allergen Reactions   Amoxicillin Hives                                               Past Medical History:  Diagnosis Date   Hypothyroidism    Obesity     Social History   Socioeconomic History   Marital status: Legally Separated    Spouse name: Not on file   Number of children: Not on file   Years of education: Not on file   Highest education  level: Not on file  Occupational History   Not on file  Tobacco Use   Smoking status: Former    Packs/day: 0.25    Years: 13.00    Additional pack years: 0.00    Total pack years: 3.25    Types: Cigarettes   Smokeless tobacco: Never  Vaping Use   Vaping Use: Every day   Substances: Nicotine   Devices: juul  Substance and Sexual Activity   Alcohol use: No   Drug use: No   Sexual activity: Yes    Birth control/protection: Surgical    Comment: pt states she is only with females.  Other Topics Concern   Not on file  Social History Narrative   Not on file   Social Determinants of Health   Financial Resource Strain: Not on file  Food Insecurity:  Not on file  Transportation Needs: Not on file  Physical Activity: Not on file  Stress: Not on file  Social Connections: Not on file  Intimate Partner Violence: Not on file    Family History  Problem Relation Age of Onset   Diabetes Father    Diabetes Sister    Healthy Mother      Mardella Layman, MD 06/04/22 1110

## 2022-06-23 ENCOUNTER — Other Ambulatory Visit: Payer: Self-pay | Admitting: Nurse Practitioner

## 2022-06-25 NOTE — Telephone Encounter (Signed)
Requested medication (s) are due for refill today: yes  Requested medication (s) are on the active medication list: yes  Last refill:  05/25/22   Future visit scheduled: no  Notes to clinic:  Unable to refill per protocol, last refill by another provider not at this practice, routing for approval.     Requested Prescriptions  Pending Prescriptions Disp Refills   levothyroxine (SYNTHROID) 88 MCG tablet [Pharmacy Med Name: LEVOTHYROXINE 0.088MG  ( ) TAB] 30 tablet 0    Sig: TAKE 1 TABLET(88 MCG) BY MOUTH DAILY     Endocrinology:  Hypothyroid Agents Failed - 06/23/2022 10:34 AM      Failed - TSH in normal range and within 360 days    TSH  Date Value Ref Range Status  05/24/2022 43.332 (H) 0.350 - 4.500 uIU/mL Final    Comment:    Performed by a 3rd Generation assay with a functional sensitivity of <=0.01 uIU/mL. Performed at [facility], [address] [place], [place] [address]   10/13/2021 17.600 (H) 0.450 - 4.500 uIU/mL Final         Passed - Valid encounter within last 12 months    Recent Outpatient Visits           5 months ago Hypothyroidism, unspecified type   [place] [facility] Grayce Sessions, NP   8 months ago Acute bacterial conjunctivitis of both eyes   [place] [facility] Grayce Sessions, NP   9 months ago Screening for diabetes mellitus   [place] [facility] Grayce Sessions, NP

## 2022-07-30 ENCOUNTER — Other Ambulatory Visit: Payer: Self-pay

## 2022-07-30 ENCOUNTER — Ambulatory Visit (HOSPITAL_COMMUNITY)
Admission: RE | Admit: 2022-07-30 | Discharge: 2022-07-30 | Disposition: A | Discharge status: Home / Self Care | Payer: BC Managed Care – PPO | Source: Ambulatory Visit | Attending: Internal Medicine | Admitting: Internal Medicine

## 2022-07-30 ENCOUNTER — Encounter (HOSPITAL_COMMUNITY): Payer: Self-pay

## 2022-07-30 VITALS — BP 112/70 | HR 85 | Temp 98.6°F | Resp 18

## 2022-07-30 DIAGNOSIS — R051 Acute cough: Secondary | ICD-10-CM | POA: Diagnosis not present

## 2022-07-30 DIAGNOSIS — J0100 Acute maxillary sinusitis, unspecified: Secondary | ICD-10-CM

## 2022-07-30 DIAGNOSIS — J029 Acute pharyngitis, unspecified: Secondary | ICD-10-CM | POA: Diagnosis not present

## 2022-07-30 LAB — POCT RAPID STREP A (OFFICE): Rapid Strep A Screen: NEGATIVE

## 2022-07-30 MED ORDER — AZITHROMYCIN 250 MG PO TABS: ORAL_TABLET | ORAL | 0 refills | Status: DC

## 2022-07-30 NOTE — Discharge Instructions (Signed)
Your strep test was negative. Recommend salt water gargling and over-the-counter Ibuprofen/Tylenol as needed for pain if you are not allergic.   Return in 2 to 3 days if no improvement. Please go directly to the Emergency Department immediately should you begin to have any of the following symptoms: increased pain, persistent fevers, difficulty swallowing, difficulty talking, drooling or difficulty breathing.  

## 2022-07-30 NOTE — ED Triage Notes (Signed)
Pt reports she has had a sore throat for one week. Pt tried to take a ibuprofen today for pain and the pill stuck in her throat due to pain.

## 2022-07-30 NOTE — ED Provider Notes (Signed)
[facility]   Note:  This document was prepared using Dragon voice recognition software and may include unintentional dictation errors.  MRN: [id number] DOB: Nov 30, 1985 DATE: 07/30/22   Subjective:  Chief Complaint:  Chief Complaint  Patient presents with   Sore Throat    Sore throat for over a week only getting worse - Entered by patient   Eye Drainage    HPI: Abigail Rangel is a 37 y.o. female presenting for sore throat and sinus congestion for one week. Patient states she first developed nasal congestion one week ago. She then gradually developed a sore throat and productive cough. She reports the sore throat became worse today and it was difficult for her to swallow ibuprofen.  She reports slight improvement with the ibuprofen.  She reports several sick contacts at work. Denies fever, nausea/vomiting, abdominal pain, otalgia. Endorses sore throat, nasal congestion, productive cough. Presents NAD.  Prior to Admission medications   Medication Sig Start Date End Date Taking? Authorizing Provider  azithromycin (ZITHROMAX Z-PAK) 250 MG tablet Take 500mg  (2 tablets) once on day 1, then 250mg  (1 tablet) every 24 hours x 4 days 07/30/22  Yes Reneta Niehaus P, PA-C  buPROPion (WELLBUTRIN SR) 150 MG 12 hr tablet Take 1 tablet (150 mg total) by mouth 2 (two) times daily. 12/28/21   Grayce Sessions, NP  levothyroxine (SYNTHROID) 88 MCG tablet Take 1 tablet (88 mcg total) by mouth daily. 05/25/22   Valentino Nose, NP  ondansetron (ZOFRAN-ODT) 4 MG disintegrating tablet Take 1 tablet (4 mg total) by mouth every 8 (eight) hours as needed for nausea or vomiting. 05/24/22   Mardella Layman, MD     Allergies  Allergen Reactions   Amoxicillin Hives    History:   Past Medical History:  Diagnosis Date   Hypothyroidism    Obesity      Past Surgical History:  Procedure Laterality Date   CESAREAN SECTION     CHOLECYSTECTOMY     tonsilectomy     TUBAL LIGATION      Family  History  Problem Relation Age of Onset   Diabetes Father    Diabetes Sister    Healthy Mother     Social History   Tobacco Use   Smoking status: Former    Packs/day: 0.25    Years: 13.00    Additional pack years: 0.00    Total pack years: 3.25    Types: Cigarettes   Smokeless tobacco: Never  Vaping Use   Vaping Use: Every day   Substances: Nicotine   Devices: juul  Substance Use Topics   Alcohol use: No   Drug use: No    Review of Systems  Constitutional:  Negative for fever.  HENT:  Positive for congestion, sinus pressure, sore throat and trouble swallowing. Negative for ear pain.   Eyes:  Positive for discharge.  Respiratory:  Positive for cough.   Gastrointestinal:  Negative for abdominal pain, nausea and vomiting.  Neurological:  Positive for headaches.     Objective:   Vitals: BP 112/70   Pulse 85   Temp 98.6 F (37 C)   Resp 18   LMP 07/02/2022   SpO2 94%   Physical Exam Constitutional:      General: She is not in acute distress.    Appearance: Normal appearance. She is well-developed. She is obese. She is not ill-appearing or toxic-appearing.  HENT:     Head: Normocephalic and atraumatic.     Right Ear: Ear canal  normal. A middle ear effusion is present.     Left Ear: Ear canal normal. A middle ear effusion is present.     Mouth/Throat:     Pharynx: Uvula midline. Posterior oropharyngeal erythema present. No pharyngeal swelling or oropharyngeal exudate.     Tonsils: No tonsillar exudate or tonsillar abscesses.  Cardiovascular:     Rate and Rhythm: Normal rate and regular rhythm.     Heart sounds: Normal heart sounds.  Pulmonary:     Effort: Pulmonary effort is normal.     Breath sounds: Normal breath sounds.     Comments: Clear to auscultation bilaterally  Abdominal:     General: Bowel sounds are normal.     Palpations: Abdomen is soft.     Tenderness: There is no abdominal tenderness.  Lymphadenopathy:     Cervical: Cervical adenopathy  present.     Right cervical: Superficial cervical adenopathy present.     Left cervical: Superficial cervical adenopathy present.  Skin:    General: Skin is warm and dry.  Neurological:     General: No focal deficit present.     Mental Status: She is alert.  Psychiatric:        Mood and Affect: Mood and affect normal.     Results:  Labs: Results for orders placed or performed during the hospital encounter of 07/30/22 (from the past 24 hour(s))  POCT rapid strep A     Status: None   Collection Time: 07/30/22  5:03 PM  Result Value Ref Range   Rapid Strep A Screen Negative Negative    Radiology: No results found.   UC Course/Treatments:  Procedures: Procedures   Medications Ordered in UC: Medications - No data to display   Assessment and Plan :     ICD-10-CM   1. Acute pharyngitis, unspecified etiology  J02.9     2. Acute non-recurrent maxillary sinusitis  J01.00     3. Acute cough  R05.1      Acute pharyngitis, unspecified etiology Afebrile, nontoxic-appearing, NAD. VSS. DDX includes but not limited to: Strep, mono, viral pharyngitis, postnasal drip Strep was negative today in office.  Suspect symptoms are related to postnasal drip given sinusitis and cough. A ZPak was prescribed given sinusitis and cough. Recommend over-the-counter analgesia as needed.  Strict ED precautions were given and patient verbalized understanding.  Acute non-recurrent maxillary sinusitis  Afebrile, nontoxic-appearing, NAD. VSS. DDX includes but not limited to: Sinusitis, allergic rhinitis, nasal congestion Given worsening of symptoms with developing sore throat and cough, a ZPak was prescribed.  Recommend over-the-counter analgesia as needed for pain. Strict ED precautions were given and patient verbalized understanding.  Acute cough See notes above    ED Discharge Orders          Ordered    azithromycin (ZITHROMAX Z-PAK) 250 MG tablet        07/30/22 1716             PDMP  not reviewed this encounter.     Mariane Burpee P, PA-C 07/30/22 1726

## 2022-08-21 ENCOUNTER — Other Ambulatory Visit: Payer: Self-pay

## 2022-08-21 ENCOUNTER — Telehealth: Payer: Self-pay

## 2022-08-21 DIAGNOSIS — E039 Hypothyroidism, unspecified: Secondary | ICD-10-CM

## 2022-08-22 NOTE — Telephone Encounter (Signed)
Kierstan Auer Ann Aariya Ferrick, CMA  ?

## 2022-09-07 ENCOUNTER — Other Ambulatory Visit (INDEPENDENT_AMBULATORY_CARE_PROVIDER_SITE_OTHER): Payer: BC Managed Care – PPO

## 2022-09-07 DIAGNOSIS — E039 Hypothyroidism, unspecified: Secondary | ICD-10-CM

## 2022-09-07 LAB — TSH: TSH: 44.28 u[IU]/mL — ABNORMAL HIGH (ref 0.35–5.50)

## 2022-09-07 LAB — T4, FREE: Free T4: 0.49 ng/dL — ABNORMAL LOW (ref 0.60–1.60)

## 2022-09-10 ENCOUNTER — Ambulatory Visit (INDEPENDENT_AMBULATORY_CARE_PROVIDER_SITE_OTHER): Payer: BC Managed Care – PPO | Admitting: Primary Care

## 2022-09-10 ENCOUNTER — Encounter (INDEPENDENT_AMBULATORY_CARE_PROVIDER_SITE_OTHER): Payer: Self-pay | Admitting: Primary Care

## 2022-09-10 VITALS — BP 118/79 | HR 89 | Resp 16 | Wt 301.8 lb

## 2022-09-10 DIAGNOSIS — R21 Rash and other nonspecific skin eruption: Secondary | ICD-10-CM | POA: Diagnosis not present

## 2022-09-10 MED ORDER — TRIAMCINOLONE ACETONIDE 0.1 % EX CREA: 1.0000 | TOPICAL_CREAM | Freq: Two times a day (BID) | CUTANEOUS | 1 refills | Status: DC

## 2022-09-10 NOTE — Progress Notes (Unsigned)
[facility]  Abigail Rangel, is a 37 y.o. female  ZOX:[id number]  WJX:[id number]  DOB - 01-26-1986  Chief Complaint  Patient presents with   Rash    On back Itchy Noticed  7 months ago        Subjective:   Abigail Rangel is a 37 y.o. morbid female here today for a acute visit. She has had a itchy non painful rash right upper shoulder for 7 months. She has denied camping, wooded or any insect bites ( to her knowledge) Patient has No headache, No chest pain, No abdominal pain - No Nausea, No new weakness tingling or numbness, No Cough - shortness of breath  No problems updated.  Allergies  Allergen Reactions   Amoxicillin Hives    Past Medical History:  Diagnosis Date   Hypothyroidism    Obesity     Current Outpatient Medications on File Prior to Visit  Medication Sig Dispense Refill   azithromycin (ZITHROMAX Z-PAK) 250 MG tablet Take 500mg  (2 tablets) once on day 1, then 250mg  (1 tablet) every 24 hours x 4 days (Patient not taking: Reported on 09/10/2022) 1 each 0   buPROPion (WELLBUTRIN SR) 150 MG 12 hr tablet Take 1 tablet (150 mg total) by mouth 2 (two) times daily. 180 tablet 1   levothyroxine (SYNTHROID) 88 MCG tablet Take 1 tablet (88 mcg total) by mouth daily. 30 tablet 0   ondansetron (ZOFRAN-ODT) 4 MG disintegrating tablet Take 1 tablet (4 mg total) by mouth every 8 (eight) hours as needed for nausea or vomiting. 15 tablet 0   No current facility-administered medications on file prior to visit.    Objective:   Vitals:   09/10/22 1022  BP: 118/79  Pulse: 89  Resp: 16  SpO2: 99%  Weight: (Abnormal) 301 lb 12.8 oz (136.9 kg)    Comprehensive ROS Pertinent positive and negative noted in HPI   Exam General appearance : Awake, alert, not in any distress. Speech Clear. Not toxic looking HEENT: Atraumatic and Normocephalic, pupils equally reactive to light and accomodation Neck: Supple, no JVD. No cervical lymphadenopathy.  Chest: Good air  entry bilaterally, no added sounds  CVS: S1 S2 regular, no murmurs.  Abdomen: Bowel sounds present, Non tender and not distended with no gaurding, rigidity or rebound. Extremities: B/L Lower Ext shows no edema, both legs are warm to touch Neurology: Awake alert, and oriented X 3, CN II-XII intact, Non focal Skin:  Rash  Data Review Lab Results  Component Value Date   HGBA1C 5.7 (H) 05/24/2022   HGBA1C 5.3 09/01/2021    Assessment & Plan  Abigail Rangel was seen today for rash.  Diagnoses and all orders for this visit:  Rash and nonspecific skin eruption  Other orders -     triamcinolone cream (KENALOG) 0.1 %; Apply 1 Application topically 2 (two) times daily.    There are no diagnoses linked to this encounter. There are no diagnoses linked to this encounter.   Patient have been counseled extensively about nutrition and exercise. Other issues discussed during this visit include: low cholesterol diet, weight control and daily exercise, foot care, annual eye examinations at Ophthalmology, importance of adherence with medications and regular follow-up. We also discussed long term complications of uncontrolled diabetes and hypertension.   No follow-ups on file.  The patient was given clear instructions to go to ER or return to medical center if symptoms don't improve, worsen or new problems develop. The patient verbalized understanding. The patient was told to  call to get lab results if they haven't heard anything in the next week.   This note has been created with Education officer, environmental. Any transcriptional errors are unintentional.   Grayce Sessions, NP 09/10/2022, 10:59 AM

## 2022-09-11 ENCOUNTER — Encounter: Payer: Self-pay | Admitting: "Endocrinology

## 2022-09-11 ENCOUNTER — Ambulatory Visit: Payer: BC Managed Care – PPO | Admitting: "Endocrinology

## 2022-09-11 VITALS — BP 120/90 | HR 106 | Ht 70.0 in | Wt 303.2 lb

## 2022-09-11 DIAGNOSIS — E039 Hypothyroidism, unspecified: Secondary | ICD-10-CM

## 2022-09-11 MED ORDER — LEVOTHYROXINE SODIUM 200 MCG PO TABS: 200.0000 ug | ORAL_TABLET | Freq: Every day | ORAL | 1 refills | Status: DC

## 2022-09-11 NOTE — Progress Notes (Signed)
Outpatient Endocrinology Note Altamese Forsyth, MD  09/11/22   Abigail Rangel [birth year] [id number]  Referring Provider: Mardella Layman, MD Primary Care Provider: Grayce Sessions, NP Subjective  Chief Complaint  Patient presents with   Hypothyroidism    Assessment & Plan  [name] was seen today for hypothyroidism.  Diagnoses and all orders for this visit:  Hypothyroidism, unspecified type -     TSH; Future -     T4, free; Future  Other orders -     levothyroxine (SYNTHROID) 200 MCG tablet; Take 1 tablet (200 mcg total) by mouth daily.    [name] is currently taking no thyroid medication. Patient is currently clinically and biochemically hypothyroid.  Educated on thyroid axis.  Recommend the following: Take levothyroxine 200 mcg every morning.  Advised to take levothyroxine first thing in the morning on empty stomach and wait at least 30 minutes to 1 hour before eating or drinking anything or taking any other medications. Space out levothyroxine by 4 hours from any acid reflux medication/fibrate/iron/calcium/multivitamin. Advised to take birth control pills and nutritional supplements in the evening. Repeat lab before next visit or sooner if symptoms of hyperthyroidism or hypothyroidism develop.  Notify us immediately in case of pregnancy/breastfeeding or significant weight gain or loss. Counseled on compliance and follow up needs.  I have reviewed current medications, nurse's notes, allergies, vital signs, past medical and surgical history, family medical history, and social history for this encounter. Counseled patient on symptoms, examination findings, lab findings, imaging results, treatment decisions and monitoring and prognosis. The patient understood the recommendations and agrees with the treatment plan. All questions regarding treatment plan were fully answered.   Return in about 3 months (around 12/14/2022) for visit + labs before next  visit.   Altamese Jennings, MD  09/11/22   I have reviewed current medications, nurse's notes, allergies, vital signs, past medical and surgical history, family medical history, and social history for this encounter. Counseled patient on symptoms, examination findings, lab findings, imaging results, treatment decisions and monitoring and prognosis. The patient understood the recommendations and agrees with the treatment plan. All questions regarding treatment plan were fully answered.   History of Present Illness Abigail Rangel is a 37 y.o. year old female who presents to our clinic with hypothyroidism diagnosed in 2019.    Stopped levothyroxine due to muscle cramps, which she later found was due to non-compliance.   Symptoms suggestive of HYPOTHYROIDISM:  fatigue Yes weight gain Yes cold intolerance  Yes constipation  Yes  Compressive symptoms:  dysphagia  No dysphonia  No positional dyspnea (especially with simultaneous arms elevation)  No  Smokes  Yes On biotin  No Personal history of head/neck surgery/irradiation  No other than tonsillectomy   Physical Exam  BP (!) 120/90   Pulse (!) 106   Ht 5\' 10"  (1.778 m)   Wt (!) 303 lb 3.2 oz (137.5 kg)   SpO2 98%   BMI 43.50 kg/m  Constitutional: well developed, well nourished Head: normocephalic, atraumatic, no exophthalmos Eyes: sclera anicteric, no redness Neck: no thyromegaly, no thyroid tenderness; no nodules palpated Lungs: normal respiratory effort Neurology: alert and oriented, no fine hand tremor Skin: dry, no appreciable rashes Musculoskeletal: no appreciable defects Psychiatric: normal mood and affect  Allergies Allergies  Allergen Reactions   Amoxicillin Hives    Current Medications Patient's Medications  New Prescriptions   LEVOTHYROXINE (SYNTHROID) 200 MCG TABLET    Take 1 tablet (200 mcg total) by mouth daily.  Previous  Medications   AZITHROMYCIN (ZITHROMAX Z-PAK) 250 MG TABLET    Take 500mg  (2  tablets) once on day 1, then 250mg  (1 tablet) every 24 hours x 4 days   BUPROPION (WELLBUTRIN SR) 150 MG 12 HR TABLET    Take 1 tablet (150 mg total) by mouth 2 (two) times daily.   ONDANSETRON (ZOFRAN-ODT) 4 MG DISINTEGRATING TABLET    Take 1 tablet (4 mg total) by mouth every 8 (eight) hours as needed for nausea or vomiting.   TRIAMCINOLONE CREAM (KENALOG) 0.1 %    Apply 1 Application topically 2 (two) times daily.  Modified Medications   No medications on file  Discontinued Medications   LEVOTHYROXINE (SYNTHROID) 88 MCG TABLET    Take 1 tablet (88 mcg total) by mouth daily.    Past Medical History Past Medical History:  Diagnosis Date   Hypothyroidism    Obesity     Past Surgical History Past Surgical History:  Procedure Laterality Date   CESAREAN SECTION     CHOLECYSTECTOMY     tonsilectomy     TUBAL LIGATION      Family History family history includes Diabetes in her father and sister; Healthy in her mother.  Social History Social History   Socioeconomic History   Marital status: Divorced    Spouse name: Not on file   Number of children: Not on file   Years of education: Not on file   Highest education level: Not on file  Occupational History   Not on file  Tobacco Use   Smoking status: Former    Current packs/day: 0.25    Average packs/day: 0.3 packs/day for 13.0 years (3.3 ttl pk-yrs)    Types: Cigarettes   Smokeless tobacco: Never  Vaping Use   Vaping status: Every Day   Substances: Nicotine   Devices: juul  Substance and Sexual Activity   Alcohol use: No   Drug use: No   Sexual activity: Yes    Birth control/protection: Surgical    Comment: pt states she is only with females.  Other Topics Concern   Not on file  Social History Narrative   Not on file   Social Determinants of Health   Financial Resource Strain: Not on file  Food Insecurity: Not on file  Transportation Needs: Not on file  Physical Activity: Not on file  Stress: Not on file   Social Connections: Not on file  Intimate Partner Violence: Not on file    Laboratory Investigations Lab Results  Component Value Date   TSH 44.28 (H) 09/07/2022   TSH 43.332 (H) 05/24/2022   TSH 75.176 (H) 12/15/2021   FREET4 0.49 (L) 09/07/2022   FREET4 <0.25 (L) 12/15/2021   FREET4 0.76 (L) 10/13/2021     No results found for: "TSI"   No components found for: "TRAB"   Lab Results  Component Value Date   CHOL 139 09/01/2021   Lab Results  Component Value Date   HDL 38 (L) 09/01/2021   Lab Results  Component Value Date   LDLCALC 69 09/01/2021   Lab Results  Component Value Date   TRIG 193 (H) 09/01/2021   Lab Results  Component Value Date   CHOLHDL 3.7 09/01/2021   Lab Results  Component Value Date   CREATININE 0.69 05/24/2022   No results found for: "GFR"    Component Value Date/Time   NA 137 05/24/2022 1949   NA 139 09/01/2021 0944   K 3.9 05/24/2022 1949   CL 105 05/24/2022  1949   CO2 23 05/24/2022 1949   GLUCOSE 97 05/24/2022 1949   BUN 9 05/24/2022 1949   BUN 9 09/01/2021 0944   CREATININE 0.69 05/24/2022 1949   CALCIUM 9.2 05/24/2022 1949   PROT 7.7 05/24/2022 1949   PROT 7.3 09/01/2021 0944   ALBUMIN 3.8 05/24/2022 1949   ALBUMIN 4.7 09/01/2021 0944   AST 20 05/24/2022 1949   ALT 29 05/24/2022 1949   ALKPHOS 65 05/24/2022 1949   BILITOT 0.6 05/24/2022 1949   BILITOT 0.5 09/01/2021 0944   GFRNONAA >60 05/24/2022 1949   GFRAA >60 05/18/2019 2158      Latest Ref Rng & Units 05/24/2022    7:49 PM 12/15/2021   12:48 PM 09/01/2021    9:44 AM  BMP  Glucose 70 - 99 mg/dL 97  086  92   BUN 6 - 20 mg/dL 9  10  9    Creatinine 0.44 - 1.00 mg/dL 5.78  4.69  6.29   BUN/Creat Ratio 9 - 23   13   Sodium 135 - 145 mmol/L 137  137  139   Potassium 3.5 - 5.1 mmol/L 3.9  4.0  4.0   Chloride 98 - 111 mmol/L 105  104  100   CO2 22 - 32 mmol/L 23  28  23    Calcium 8.9 - 10.3 mg/dL 9.2  9.5  52.8        Component Value Date/Time   WBC 8.0 05/24/2022  1949   RBC 5.32 (H) 05/24/2022 1949   HGB 11.0 (L) 05/24/2022 1949   HGB 11.6 09/01/2021 0944   HCT 35.3 (L) 05/24/2022 1949   HCT 39.1 09/01/2021 0944   PLT 173 05/24/2022 1949   PLT 176 09/01/2021 0944   MCV 66.4 (L) 05/24/2022 1949   MCV 69 (L) 09/01/2021 0944   MCH 20.7 (L) 05/24/2022 1949   MCHC 31.2 05/24/2022 1949   RDW 16.1 (H) 05/24/2022 1949   RDW 17.3 (H) 09/01/2021 0944   LYMPHSABS 2.3 09/01/2021 0944   MONOABS 0.7 05/18/2019 2158   EOSABS 0.0 09/01/2021 0944   BASOSABS 0.0 09/01/2021 0944      Parts of this note may have been dictated using voice recognition software. There may be variances in spelling and vocabulary which are unintentional. Not all errors are proofread. Please notify the Thereasa Parkin if any discrepancies are noted or if the meaning of any statement is not clear.

## 2022-09-18 ENCOUNTER — Ambulatory Visit (INDEPENDENT_AMBULATORY_CARE_PROVIDER_SITE_OTHER): Payer: BC Managed Care – PPO | Admitting: Physician Assistant

## 2022-09-18 ENCOUNTER — Other Ambulatory Visit (HOSPITAL_COMMUNITY)
Admission: RE | Admit: 2022-09-18 | Discharge: 2022-09-18 | Disposition: A | Discharge status: Home / Self Care | Payer: BC Managed Care – PPO | Source: Ambulatory Visit | Attending: Critical Care Medicine | Admitting: Critical Care Medicine

## 2022-09-18 VITALS — BP 125/88 | HR 96 | Resp 16 | Wt 302.2 lb

## 2022-09-18 DIAGNOSIS — Z124 Encounter for screening for malignant neoplasm of cervix: Secondary | ICD-10-CM | POA: Diagnosis not present

## 2022-09-18 DIAGNOSIS — R351 Nocturia: Secondary | ICD-10-CM | POA: Diagnosis not present

## 2022-09-18 DIAGNOSIS — L732 Hidradenitis suppurativa: Secondary | ICD-10-CM | POA: Insufficient documentation

## 2022-09-18 DIAGNOSIS — Z113 Encounter for screening for infections with a predominantly sexual mode of transmission: Secondary | ICD-10-CM | POA: Diagnosis not present

## 2022-09-18 MED ORDER — MUPIROCIN 2 % EX OINT: 1.0000 | TOPICAL_OINTMENT | Freq: Two times a day (BID) | CUTANEOUS | 1 refills | Status: DC

## 2022-09-18 NOTE — Patient Instructions (Addendum)
Drink 80 ounces water daily  Hidradenitis Suppurativa Hidradenitis suppurativa is a long-term (chronic) skin disease. It is similar to a severe form of acne, but it affects areas of the body where acne would be unusual, especially areas of the body where skin rubs against skin and becomes moist. These include: Underarms. Groin. Genital area. Buttocks. Upper thighs. Breasts. Hidradenitis suppurativa may start out as small lumps or pimples caused by blocked skin pores, sweat glands, or hair follicles. Pimples may develop into deep sores that break open (rupture) and drain pus. Over time, affected areas of skin may thicken and become scarred. This condition is rare and does not spread from person to person (non-contagious). What are the causes? The exact cause of this condition is not known. It may be related to: Female and female hormones. An overactive disease-fighting system (immune system). The immune system may over-react to blocked hair follicles or sweat glands and cause swelling and pus-filled sores. What increases the risk? You are more likely to develop this condition if you: Are female. Are 89-21 years old. Have a family history of hidradenitis suppurativa. Have a personal history of acne. Are overweight. Smoke. Take the medicine lithium. What are the signs or symptoms? The first symptoms are usually painful bumps in the skin, similar to pimples. The condition may get worse over time (progress), or it may only cause mild symptoms. If the disease progresses, symptoms may include: Skin bumps getting bigger and growing deeper into the skin. Bumps rupturing and draining pus. Itchy, infected skin. Skin getting thicker and scarred. Tunnels under the skin (fistulas) where pus drains from a bump. Pain during daily activities, such as pain during walking if your groin area is affected. Emotional problems, such as stress or depression. This condition may affect your appearance and  your ability or willingness to wear certain clothes or do certain activities. How is this diagnosed? This condition is diagnosed by a health care provider who specializes in skin conditions (dermatologist). You may be diagnosed based on: Your symptoms and medical history. A physical exam. Testing a pus sample for infection. Blood tests. How is this treated? Your treatment will depend on how severe your symptoms are. The same treatment will not work for everybody with this condition. You may need to try several treatments to find what works best for you. Treatment may include: Cleaning and bandaging (dressing) your wounds as needed. Lifestyle changes, such as new skin care routines. Taking medicines, such as: Antibiotics. Acne medicines. Medicines to reduce the activity of the immune system. A diabetes medicine (metformin). Birth control pills, for women. Steroids to reduce swelling and pain. Working with a mental health care provider, if you experience emotional distress due to this condition. If you have severe symptoms that do not get better with medicine, you may need surgery. Surgery may involve: Using a laser to clear the skin and remove hair follicles. Opening and draining deep sores. Removing the areas of skin that are diseased and scarred. Follow these instructions at home: Medicines  Take over-the-counter and prescription medicines only as told by your health care provider. If you were prescribed antibiotics, take them as told by your health care provider. Do not stop using the antibiotic even if your condition improves. Skin care If you have open wounds, cover them with a clean dressing as told by your health care provider. Keep wounds clean by washing them gently with soap and water when you bathe. Do not shave the areas where you get hidradenitis  suppurativa. Wear loose-fitting clothes. Try to avoid getting overheated or sweaty. If you get sweaty or wet, change into clean,  dry clothes as soon as you can. To help relieve pain and itchiness, cover sore areas with a warm, clean washcloth (warm compress) for 5-10 minutes as often as needed. Your healthcare provider may recommend an antiperspirant deodorant that may be gentle on your skin. A daily antiseptic wash to cleanse affected areas may be suggested by your healthcare provider. General instructions Learn as much as you can about your disease so that you have an active role in your treatment. Work closely with your health care provider to find treatments that work for you. If you are overweight, work with your health care provider to lose weight as recommended. Do not use any products that contain nicotine or tobacco. These products include cigarettes, chewing tobacco, and vaping devices, such as e-cigarettes. If you need help quitting, ask your health care provider. If you struggle with living with this condition, talk with your health care provider or work with a mental health care provider as recommended. Keep all follow-up visits. Where to find more information Hidradenitis Suppurativa Foundation, Inc.: www.hs-foundation.org American Academy [employer] Dermatology: InfoExam.si Contact a health care provider if: You have a flare-up of hidradenitis suppurativa. You have a fever or chills. You have trouble controlling your symptoms at home. You have trouble doing your daily activities because of your symptoms. You have trouble dealing with emotional problems related to your condition. Summary Hidradenitis suppurativa is a long-term (chronic) skin disease. It is similar to a severe form of acne, but it affects areas of the body where acne would be unusual. The first symptoms are usually painful bumps in the skin, similar to pimples. The condition may only cause mild symptoms, or it may get worse over time (progress). If you have open wounds, cover them with a clean dressing as told by your health care provider. Keep  wounds clean by washing them gently with soap and water when you bathe. Besides skin care, treatment may include medicines, laser treatment, and surgery. This information is not intended to replace advice given to you by your health care provider. Make sure you discuss any questions you have with your health care provider. Document Revised: 04/05/2021 Document Reviewed: 04/05/2021 Elsevier Patient Education  2024 [employer].

## 2022-09-18 NOTE — Progress Notes (Signed)
Patient ID: Abigail Rangel, female   DOB: 21-Oct-1985, 38 y.o.   MRN: [id number]   Abigail Rangel, is a 37 y.o. female  [id number]  [id number]  DOB - May 14, 1985  Chief Complaint  Patient presents with   Gynecologic Exam       Subjective:   Abigail Rangel is a 37 y.o. female here today for pap.  She has never had abnormal.  Periods irregular and always have been.  LMP 06/2022.  Has SSP and no chance of pregnancy.  Also had BTL years ago after having kids.  She has never been tested/diagnosed with PCOS.  She would like to do std testing  She has to urinate every 2 hours at night.  She has been checked for UTI and diabetes.  This has been going on for at least 6-9 months.  No dysuria or daytime symptoms.    Also gets a lot of boils in genital and buttocks region and has for many years.    Problem  Hydradenitis    ALLERGIES: Allergies  Allergen Reactions   Amoxicillin Hives    PAST MEDICAL HISTORY: Past Medical History:  Diagnosis Date   Hypothyroidism    Obesity     MEDICATIONS AT HOME: Prior to Admission medications   Medication Sig Start Date End Date Taking? Authorizing Provider  mupirocin ointment (BACTROBAN) 2 % Apply 1 Application topically 2 (two) times daily. 09/18/22  Yes Iracema Lanagan M, PA-C  azithromycin (ZITHROMAX Z-PAK) 250 MG tablet Take 500mg  (2 tablets) once on day 1, then 250mg  (1 tablet) every 24 hours x 4 days Patient not taking: Reported on 09/10/2022 07/30/22   Hermanns, Ashlee P, PA-C  buPROPion (WELLBUTRIN SR) 150 MG 12 hr tablet Take 1 tablet (150 mg total) by mouth 2 (two) times daily. Patient not taking: Reported on 09/11/2022 12/28/21   Grayce Sessions, NP  levothyroxine (SYNTHROID) 200 MCG tablet Take 1 tablet (200 mcg total) by mouth daily. 09/11/22   Motwani, Carin Hock, MD  ondansetron (ZOFRAN-ODT) 4 MG disintegrating tablet Take 1 tablet (4 mg total) by mouth every 8 (eight) hours as needed for nausea or vomiting. Patient not taking:  Reported on 09/11/2022 05/24/22   Mardella Layman, MD  triamcinolone cream (KENALOG) 0.1 % Apply 1 Application topically 2 (two) times daily. 09/10/22   Grayce Sessions, NP    ROS: Neg HEENT Neg resp Neg cardiac Neg GI Neg MS Neg psych Neg neuro  Objective:   Vitals:   09/18/22 1610  BP: 125/88  Pulse: 96  Resp: 16  SpO2: 98%  Weight: (!) 302 lb 3.2 oz (137.1 kg)   Exam General appearance : Awake, alert, not in any distress. Speech Clear. Not toxic looking HEENT: Atraumatic and Normocephalic Neck: Supple, no JVD. No cervical lymphadenopathy.  Chest: Good air entry bilaterally, CTAB.  No rales/rhonchi/wheezing CVS: S1 S2 regular, no murmurs.  GU:  external genitalia WNL other than scarring from typical hidradenitis type areas(buttocks, upper/inner thighs and pubic region).  None active.  Speculum inserted.  Cervix visualized and without lesion.  Pap and swab taken.  Bimanual unremarkable Extremities: B/L Lower Ext shows no edema, both legs are warm to touch Neurology: Awake alert, and oriented X 3, CN II-XII intact, Non focal Skin: No Rash  Data Review Lab Results  Component Value Date   HGBA1C 5.7 (H) 05/24/2022   HGBA1C 5.3 09/01/2021    Assessment & Plan   1. Hydradenitis Patient education provided.  Consider fasting insulin level with endo.   -  mupirocin ointment (BACTROBAN) 2 %; Apply 1 Application topically 2 (two) times daily.  Dispense: 22 g; Refill: 1  2. Screening for cervical cancer  - Cytology - PAP - HIV antibody (with reflex) - RPR  3. Screening examination for STD (sexually transmitted disease) - Cytology - PAP - Cervicovaginal ancillary only  4. Nocturia - Ambulatory referral to Urology    Return if symptoms worsen or fail to improve.  The patient was given clear instructions to go to ER or return to medical center if symptoms don't improve, worsen or new problems develop. The patient verbalized understanding. The patient was told to call  to get lab results if they haven't heard anything in the next week.      Georgian Co, PA-C [facility], [place] [phone]   09/18/2022, 4:26 PM

## 2022-09-20 LAB — CYTOLOGY - PAP: Diagnosis: NEGATIVE

## 2022-09-20 LAB — CERVICOVAGINAL ANCILLARY ONLY
Bacterial Vaginitis (gardnerella): NEGATIVE
Comment: NEGATIVE
Comment: NEGATIVE
Comment: NORMAL
Neisseria Gonorrhea: NEGATIVE

## 2022-11-12 ENCOUNTER — Encounter (HOSPITAL_COMMUNITY): Payer: Self-pay

## 2022-11-12 ENCOUNTER — Other Ambulatory Visit: Payer: Self-pay

## 2022-11-12 ENCOUNTER — Ambulatory Visit (HOSPITAL_COMMUNITY)
Admission: RE | Admit: 2022-11-12 | Discharge: 2022-11-12 | Disposition: A | Discharge status: Home / Self Care | Payer: BC Managed Care – PPO | Source: Ambulatory Visit | Attending: Physician Assistant | Admitting: Physician Assistant

## 2022-11-12 VITALS — BP 105/70 | HR 95 | Temp 98.8°F | Resp 20

## 2022-11-12 DIAGNOSIS — Z1152 Encounter for screening for COVID-19: Secondary | ICD-10-CM | POA: Diagnosis not present

## 2022-11-12 LAB — POCT INFLUENZA A/B
Influenza A, POC: NEGATIVE
Influenza B, POC: NEGATIVE

## 2022-11-12 NOTE — ED Triage Notes (Signed)
Cough started yesterday.  Today, woke feeling terrible.  Symptoms include body aches, runny nose, hot and cold episodes, headache, body aches..    Has taken ibuprofen

## 2022-11-13 LAB — SARS CORONAVIRUS 2 (TAT 6-24 HRS): SARS Coronavirus 2: POSITIVE — AB

## 2022-11-14 ENCOUNTER — Telehealth (HOSPITAL_COMMUNITY): Payer: Self-pay

## 2022-11-14 NOTE — Telephone Encounter (Signed)
Pt called requesting a work note for another day due positive COVID test. Pt stated she was not feeling any better. I went to Jerrilyn Cairo, FNP, she ok'd extra day on work note.

## 2022-11-19 ENCOUNTER — Encounter (HOSPITAL_COMMUNITY): Payer: Self-pay

## 2022-11-19 NOTE — ED Provider Notes (Signed)
[facility]    CSN: [id number] Arrival date & time: 11/12/22  1858      History   Chief Complaint Chief Complaint  Patient presents with   Cough    Cough, fever,  and body aches. Feels like the flu or covid - Entered by patient    HPI Abigail Rangel is a 37 y.o. female.   Patient here today for evaluation of cough, fever, body aches and runny nose that started yesterday.  She reports that she has taken ibuprofen without resolution.  She denies any vomiting or diarrhea.  The history is provided by the patient.  Cough Associated symptoms: myalgias and sore throat   Associated symptoms: no chills, no ear pain, no eye discharge, no fever, no shortness of breath and no wheezing     Past Medical History:  Diagnosis Date   Hypothyroidism    Obesity     Patient Active Problem List   Diagnosis Date Noted   Hydradenitis 09/18/2022   Hypothyroidism 12/16/2021   PTSD (post-traumatic stress disorder) 12/16/2021   Anxiety and depression 12/09/2017    Past Surgical History:  Procedure Laterality Date   CESAREAN SECTION     CHOLECYSTECTOMY     tonsilectomy     TUBAL LIGATION      OB History     Gravida  4   Para  4   Term  3   Preterm  1   AB  0   Living  0      SAB  0   IAB  0   Ectopic  0   Multiple  0   Live Births  0            Home Medications    Prior to Admission medications   Medication Sig Start Date End Date Taking? Authorizing Provider  azithromycin (ZITHROMAX Z-PAK) 250 MG tablet Take 500mg  (2 tablets) once on day 1, then 250mg  (1 tablet) every 24 hours x 4 days Patient not taking: Reported on 09/10/2022 07/30/22   Hermanns, Ashlee P, PA-C  buPROPion (WELLBUTRIN SR) 150 MG 12 hr tablet Take 1 tablet (150 mg total) by mouth 2 (two) times daily. Patient not taking: Reported on 09/11/2022 12/28/21   Grayce Sessions, NP  levothyroxine (SYNTHROID) 200 MCG tablet Take 1 tablet (200 mcg total) by mouth daily. 09/11/22    Altamese Dalton, MD  mupirocin ointment (BACTROBAN) 2 % Apply 1 Application topically 2 (two) times daily. Patient not taking: Reported on 11/12/2022 09/18/22   Anders Simmonds, PA-C  ondansetron (ZOFRAN-ODT) 4 MG disintegrating tablet Take 1 tablet (4 mg total) by mouth every 8 (eight) hours as needed for nausea or vomiting. Patient not taking: Reported on 09/11/2022 05/24/22   Mardella Layman, MD  triamcinolone cream (KENALOG) 0.1 % Apply 1 Application topically 2 (two) times daily. Patient not taking: Reported on 11/12/2022 09/10/22   Grayce Sessions, NP    Family History Family History  Problem Relation Age of Onset   Diabetes Father    Diabetes Sister    Healthy Mother     Social History Social History   Tobacco Use   Smoking status: Every Day    Current packs/day: 0.25    Average packs/day: 0.3 packs/day for 13.0 years (3.3 ttl pk-yrs)    Types: Cigarettes   Smokeless tobacco: Never  Vaping Use   Vaping status: Every Day   Substances: Nicotine   Devices: juul  Substance Use Topics   Alcohol use:  No   Drug use: No     Allergies   Amoxicillin   Review of Systems Review of Systems  Constitutional:  Negative for chills and fever.  HENT:  Positive for congestion, sinus pressure and sore throat. Negative for ear pain.   Eyes:  Negative for discharge and redness.  Respiratory:  Positive for cough. Negative for shortness of breath and wheezing.   Gastrointestinal:  Negative for abdominal pain, diarrhea, nausea and vomiting.  Musculoskeletal:  Positive for myalgias.     Physical Exam Triage Vital Signs ED Triage Vitals  Encounter Vitals Group     BP 11/12/22 1923 105/70     Systolic BP Percentile --      Diastolic BP Percentile --      Pulse Rate 11/12/22 1923 95     Resp 11/12/22 1923 20     Temp 11/12/22 1923 98.8 F (37.1 C)     Temp Source 11/12/22 1923 Oral     SpO2 11/12/22 1923 96 %     Weight --      Height --      Head Circumference --      Peak  Flow --      Pain Score 11/12/22 1920 7     Pain Loc --      Pain Education --      Exclude from Growth Chart --    No data found.  Updated Vital Signs BP 105/70 (BP Location: Left Arm) Comment (BP Location): large cuff  Pulse 95   Temp 98.8 F (37.1 C) (Oral)   Resp 20   LMP 11/08/2022 (Approximate)   SpO2 96%    Physical Exam Vitals and nursing note reviewed.  Constitutional:      General: She is not in acute distress.    Appearance: Normal appearance. She is not ill-appearing.  HENT:     Head: Normocephalic and atraumatic.     Nose: Congestion present.     Mouth/Throat:     Mouth: Mucous membranes are moist.     Pharynx: No oropharyngeal exudate or posterior oropharyngeal erythema.  Eyes:     Conjunctiva/sclera: Conjunctivae normal.  Cardiovascular:     Rate and Rhythm: Normal rate and regular rhythm.     Heart sounds: Normal heart sounds. No murmur heard. Pulmonary:     Effort: Pulmonary effort is normal. No respiratory distress.     Breath sounds: Normal breath sounds. No wheezing, rhonchi or rales.  Skin:    General: Skin is warm and dry.  Neurological:     Mental Status: She is alert.  Psychiatric:        Mood and Affect: Mood normal.        Thought Content: Thought content normal.      UC Treatments / Results  Labs (all labs ordered are listed, but only abnormal results are displayed) Labs Reviewed  SARS CORONAVIRUS 2 (TAT 6-24 HRS) - Abnormal; Notable for the following components:      Result Value   SARS Coronavirus 2 POSITIVE (*)    All other components within normal limits  POCT INFLUENZA A/B    EKG   Radiology No results found.  Procedures Procedures (including critical care time)  Medications Ordered in UC Medications - No data to display  Initial Impression / Assessment and Plan / UC Course  I have reviewed the triage vital signs and the nursing notes.  Pertinent labs & imaging results that were available during my care of the  patient were reviewed by me and considered in my medical decision making (see chart for details).    Flu screening negative in office.  Will order COVID screening.  Encouraged symptomatic treatment, increase fluids and rest with follow-up if no gradual improvement with any further concerns.  Final Clinical Impressions(s) / UC Diagnoses   Final diagnoses:  Encounter for screening for COVID-19   Discharge Instructions   None    ED Prescriptions   None    PDMP not reviewed this encounter.   Tomi Bamberger, PA-C 11/19/22 [phone]

## 2022-12-06 ENCOUNTER — Other Ambulatory Visit: Payer: BC Managed Care – PPO

## 2022-12-06 DIAGNOSIS — E039 Hypothyroidism, unspecified: Secondary | ICD-10-CM

## 2022-12-06 LAB — T4, FREE: Free T4: 1.26 ng/dL (ref 0.60–1.60)

## 2022-12-06 LAB — TSH: TSH: 0.12 u[IU]/mL — ABNORMAL LOW (ref 0.35–5.50)

## 2022-12-13 ENCOUNTER — Encounter: Payer: Self-pay | Admitting: "Endocrinology

## 2022-12-13 ENCOUNTER — Ambulatory Visit: Payer: BC Managed Care – PPO | Admitting: "Endocrinology

## 2022-12-13 VITALS — BP 142/82 | HR 87 | Ht 70.0 in | Wt 297.6 lb

## 2022-12-13 DIAGNOSIS — E039 Hypothyroidism, unspecified: Secondary | ICD-10-CM

## 2022-12-13 MED ORDER — LEVOTHYROXINE SODIUM 200 MCG PO TABS: ORAL_TABLET | ORAL | 1 refills | Status: DC

## 2022-12-13 NOTE — Progress Notes (Signed)
Outpatient Endocrinology Note Abigail [place], MD  12/13/22   Abigail Rangel [birth year] [id number]  Referring Provider: Grayce Sessions, NP Primary Care Provider: Grayce Sessions, NP Subjective  No chief complaint on file.   Assessment & Plan  Diagnoses and all orders for this visit:  Acquired hypothyroidism -     TSH Rfx on Abnormal to Free T4; Future  Other orders -     levothyroxine (SYNTHROID) 200 MCG tablet; Take 1 pill everyday half pill on Sundays     Abigail Rangel is currently taking  levothyroxine 200 mcg every morning.  Patient is currently clinically and biochemically hypothyroid.  Educated on thyroid axis.  Recommend the following: Take levothyroxine 200 mcg every morning except Sunday take half pill ( ).  Advised to take levothyroxine first thing in the morning on empty stomach and wait at least 30 minutes to 1 hour before eating or drinking anything or taking any other medications. Space out levothyroxine by 4 hours from any acid reflux medication/fibrate/iron/calcium/multivitamin. Advised to take birth control pills and nutritional supplements in the evening. Repeat lab before next visit or sooner if symptoms of hyperthyroidism or hypothyroidism develop.  Notify us immediately in case of pregnancy/breastfeeding or significant weight gain or loss. Counseled on compliance and follow up needs.  I have reviewed current medications, nurse's notes, allergies, vital signs, past medical and surgical history, family medical history, and social history for this encounter. Counseled patient on symptoms, examination findings, lab findings, imaging results, treatment decisions and monitoring and prognosis. The patient understood the recommendations and agrees with the treatment plan. All questions regarding treatment plan were fully answered.   Return in about 3 months (around 03/15/2023) for visit, labs before next visit.   Abigail Franklin, MD   12/13/22   I have reviewed current medications, nurse's notes, allergies, vital signs, past medical and surgical history, family medical history, and social history for this encounter. Counseled patient on symptoms, examination findings, lab findings, imaging results, treatment decisions and monitoring and prognosis. The patient understood the recommendations and agrees with the treatment plan. All questions regarding treatment plan were fully answered.   History of Present Illness Abigail Rangel is a 37 y.o. year old female who presents to our clinic with hypothyroidism diagnosed in 2019.    Stopped levothyroxine due to muscle cramps, which she later found was due to non-compliance.  Currently on levothyroxine every day   Symptoms suggestive of HYPOTHYROIDISM:  fatigue Yes, improved  weight gain No cold intolerance  Yes, baseline constipation  Yes, baseline  Compressive symptoms:  dysphagia  No dysphonia  No positional dyspnea (especially with simultaneous arms elevation)  No  Smokes  Yes On biotin  No Personal history of head/neck surgery/irradiation  No other than tonsillectomy   Physical Exam  BP (!) 142/82   Pulse 87   Ht 5\' 10"  (1.778 m)   Wt 297 lb 9.6 oz (135 kg)   SpO2 95%   BMI 42.70 kg/m  Constitutional: well developed, well nourished Head: normocephalic, atraumatic, no exophthalmos Eyes: sclera anicteric, no redness Neck: no thyromegaly, no thyroid tenderness; no nodules palpated Lungs: normal respiratory effort Neurology: alert and oriented, no fine hand tremor Skin: dry, no appreciable rashes Musculoskeletal: no appreciable defects Psychiatric: normal mood and affect  Allergies Allergies  Allergen Reactions   Amoxicillin Hives    Current Medications Patient's Medications  New Prescriptions   No medications on file  Previous Medications   AZITHROMYCIN (ZITHROMAX Z-PAK) 250 MG TABLET  Take 500mg  (2 tablets) once on day 1, then 250mg  (1  tablet) every 24 hours x 4 days   BUPROPION (WELLBUTRIN SR) 150 MG 12 HR TABLET    Take 1 tablet (150 mg total) by mouth 2 (two) times daily.   MUPIROCIN OINTMENT (BACTROBAN) 2 %    Apply 1 Application topically 2 (two) times daily.   ONDANSETRON (ZOFRAN-ODT) 4 MG DISINTEGRATING TABLET    Take 1 tablet (4 mg total) by mouth every 8 (eight) hours as needed for nausea or vomiting.   TRIAMCINOLONE CREAM (KENALOG) 0.1 %    Apply 1 Application topically 2 (two) times daily.  Modified Medications   Modified Medication Previous Medication   LEVOTHYROXINE (SYNTHROID) 200 MCG TABLET levothyroxine (SYNTHROID) 200 MCG tablet      Take 1 pill everyday half pill on Sundays    Take 1 tablet (200 mcg total) by mouth daily.  Discontinued Medications   No medications on file    Past Medical History Past Medical History:  Diagnosis Date   Hypothyroidism    Obesity     Past Surgical History Past Surgical History:  Procedure Laterality Date   CESAREAN SECTION     CHOLECYSTECTOMY     tonsilectomy     TUBAL LIGATION      Family History family history includes Diabetes in her father and sister; Healthy in her mother.  Social History Social History   Socioeconomic History   Marital status: Divorced    Spouse name: Not on file   Number of children: Not on file   Years of education: Not on file   Highest education level: 12th grade  Occupational History   Not on file  Tobacco Use   Smoking status: Every Day    Current packs/day: 0.25    Average packs/day: 0.3 packs/day for 13.0 years (3.3 ttl pk-yrs)    Types: Cigarettes   Smokeless tobacco: Never  Vaping Use   Vaping status: Every Day   Substances: Nicotine   Devices: juul  Substance and Sexual Activity   Alcohol use: No   Drug use: No   Sexual activity: Yes    Birth control/protection: Surgical    Comment: pt states she is only with females.  Other Topics Concern   Not on file  Social History Narrative   Not on file   Social  Determinants of Health   Financial Resource Strain: High Risk (09/18/2022)   Overall Financial Resource Strain (CARDIA)    Difficulty of Paying Living Expenses: Hard  Food Insecurity: Food Insecurity Present (09/18/2022)   Hunger Vital Sign    Worried About Running Out of Food in the Last Year: Often true    Ran Out of Food in the Last Year: Sometimes true  Transportation Needs: No Transportation Needs (09/18/2022)   PRAPARE - Administrator, Civil Service (Medical): No    Lack of Transportation (Non-Medical): No  Physical Activity: Insufficiently Active (09/18/2022)   Exercise Vital Sign    Days of Exercise per Week: 1 day    Minutes of Exercise per Session: 60 min  Stress: Stress Concern Present (09/18/2022)   [employer] of Occupational Health - Occupational Stress Questionnaire    Feeling of Stress : Very much  Social Connections: Socially Isolated (09/18/2022)   Social Connection and Isolation Panel [NHANES]    Frequency of Communication with Friends and Family: Once a week    Frequency of Social Gatherings with Friends and Family: Never    Attends Religious  Services: Never    Active Member of Clubs or Organizations: No    Attends Banker Meetings: Not on file    Marital Status: Divorced  Intimate Partner Violence: Not on file    Laboratory Investigations Lab Results  Component Value Date   TSH 0.12 (L) 12/06/2022   TSH 44.28 (H) 09/07/2022   TSH 43.332 (H) 05/24/2022   FREET4 1.26 12/06/2022   FREET4 0.49 (L) 09/07/2022   FREET4 <0.25 (L) 12/15/2021     No results found for: "TSI"   No components found for: "TRAB"   Lab Results  Component Value Date   CHOL 139 09/01/2021   Lab Results  Component Value Date   HDL 38 (L) 09/01/2021   Lab Results  Component Value Date   LDLCALC 69 09/01/2021   Lab Results  Component Value Date   TRIG 193 (H) 09/01/2021   Lab Results  Component Value Date   CHOLHDL 3.7 09/01/2021   Lab Results   Component Value Date   CREATININE 0.69 05/24/2022   No results found for: "GFR"    Component Value Date/Time   NA 137 05/24/2022 1949   NA 139 09/01/2021 0944   K 3.9 05/24/2022 1949   CL 105 05/24/2022 1949   CO2 23 05/24/2022 1949   GLUCOSE 97 05/24/2022 1949   BUN 9 05/24/2022 1949   BUN 9 09/01/2021 0944   CREATININE 0.69 05/24/2022 1949   CALCIUM 9.2 05/24/2022 1949   PROT 7.7 05/24/2022 1949   PROT 7.3 09/01/2021 0944   ALBUMIN 3.8 05/24/2022 1949   ALBUMIN 4.7 09/01/2021 0944   AST 20 05/24/2022 1949   ALT 29 05/24/2022 1949   ALKPHOS 65 05/24/2022 1949   BILITOT 0.6 05/24/2022 1949   BILITOT 0.5 09/01/2021 0944   GFRNONAA >60 05/24/2022 1949   GFRAA >60 05/18/2019 2158      Latest Ref Rng & Units 05/24/2022    7:49 PM 12/15/2021   12:48 PM 09/01/2021    9:44 AM  BMP  Glucose 70 - 99 mg/dL 97  161  92   BUN 6 - 20 mg/dL 9  10  9    Creatinine 0.44 - 1.00 mg/dL 0.96  0.45  4.09   BUN/Creat Ratio 9 - 23   13   Sodium 135 - 145 mmol/L 137  137  139   Potassium 3.5 - 5.1 mmol/L 3.9  4.0  4.0   Chloride 98 - 111 mmol/L 105  104  100   CO2 22 - 32 mmol/L 23  28  23    Calcium 8.9 - 10.3 mg/dL 9.2  9.5  81.1        Component Value Date/Time   WBC 8.0 05/24/2022 1949   RBC 5.32 (H) 05/24/2022 1949   HGB 11.0 (L) 05/24/2022 1949   HGB 11.6 09/01/2021 0944   HCT 35.3 (L) 05/24/2022 1949   HCT 39.1 09/01/2021 0944   PLT 173 05/24/2022 1949   PLT 176 09/01/2021 0944   MCV 66.4 (L) 05/24/2022 1949   MCV 69 (L) 09/01/2021 0944   MCH 20.7 (L) 05/24/2022 1949   MCHC 31.2 05/24/2022 1949   RDW 16.1 (H) 05/24/2022 1949   RDW 17.3 (H) 09/01/2021 0944   LYMPHSABS 2.3 09/01/2021 0944   MONOABS 0.7 05/18/2019 2158   EOSABS 0.0 09/01/2021 0944   BASOSABS 0.0 09/01/2021 0944      Parts of this note may have been dictated using voice recognition software. There may be variances in  spelling and vocabulary which are unintentional. Not all errors are proofread. Please  notify the Thereasa Parkin if any discrepancies are noted or if the meaning of any statement is not clear.

## 2023-03-04 ENCOUNTER — Other Ambulatory Visit: Payer: Self-pay

## 2023-03-04 DIAGNOSIS — E039 Hypothyroidism, unspecified: Secondary | ICD-10-CM

## 2023-03-08 ENCOUNTER — Other Ambulatory Visit: Payer: BC Managed Care – PPO

## 2023-03-15 ENCOUNTER — Ambulatory Visit: Payer: BC Managed Care – PPO | Admitting: "Endocrinology

## 2023-04-22 ENCOUNTER — Ambulatory Visit (HOSPITAL_COMMUNITY): Payer: Self-pay

## 2023-04-23 ENCOUNTER — Ambulatory Visit (HOSPITAL_COMMUNITY)
Admission: EM | Admit: 2023-04-23 | Discharge: 2023-04-23 | Disposition: A | Discharge status: Home / Self Care | Payer: Medicaid Other | Attending: Physician Assistant | Admitting: Physician Assistant

## 2023-04-23 ENCOUNTER — Encounter (HOSPITAL_COMMUNITY): Payer: Self-pay

## 2023-04-23 ENCOUNTER — Ambulatory Visit (INDEPENDENT_AMBULATORY_CARE_PROVIDER_SITE_OTHER): Payer: Medicaid Other

## 2023-04-23 DIAGNOSIS — M25511 Pain in right shoulder: Secondary | ICD-10-CM | POA: Diagnosis not present

## 2023-04-23 DIAGNOSIS — G8929 Other chronic pain: Secondary | ICD-10-CM | POA: Diagnosis not present

## 2023-04-23 DIAGNOSIS — M79601 Pain in right arm: Secondary | ICD-10-CM

## 2023-04-23 MED ORDER — KETOROLAC TROMETHAMINE 30 MG/ML IJ SOLN
INTRAMUSCULAR | Status: AC
  Filled 2023-04-23: qty 1

## 2023-04-23 MED ORDER — KETOROLAC TROMETHAMINE 30 MG/ML IJ SOLN
30.0000 mg | Freq: Once | INTRAMUSCULAR | Status: AC
  Administered 2023-04-23: 30 mg via INTRAMUSCULAR

## 2023-04-23 NOTE — Discharge Instructions (Addendum)
 Your shoulder x-rays were normal at this time.  I suspect that you likely have a soft tissue strain likely from repetitive use.  We have provided you with a Toradol injection to help with pain management.  This is an NSAID for the next 2 to 3 days I recommend that you do not take oral NSAID's at home.  You can continue to use Tylenol as needed for further pain management.  I do recommend using warm compresses, gentle massage of performing some of the stretches that I have included in your paperwork to help with pain management.  I have provided you with a work note to excuse you until you are seen by your primary care provider.  Please follow-up with them as you likely will need physical therapy for further management.  If your symptoms are not improving you may need additional imaging to rule out soft tissue injury such as a rotator cuff concern.  If it anytime you start to notice discoloration, numbness, tingling, obvious weakness to the area please go to the emergency room for further evaluation and management.

## 2023-04-23 NOTE — ED Provider Notes (Signed)
 [facility]    CSN: [id number] Arrival date & time: 04/23/23  0805      History   Chief Complaint Chief Complaint  Patient presents with   Arm Pain    HPI Abigail Rangel is a 38 y.o. female.   HPI   The patient presents with right arm pain persisting for almost four months.  The patient has been experiencing right arm pain for nearly four months, initially attributing it to overwork or a pulled muscle. The pain has progressively worsened, affecting their ability to hold objects, with occasional episodes where the arm 'just drops.'  The pain radiates from the mid-elbow area to the shoulder and is exacerbated by lifting and turning movements. 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 Resting provides temporary relief, but the pain returns with physical activity. They report difficulty in fully extending or bending the arm without pain.  They work at [employer], where physical activities exacerbate their arm pain. Any weight on the hand that pushes downwards intensifies the pain.  They recall a fall on Halloween before the onset of pain but do not remember any immediate arm pain following the incident. They experienced bruising and swelling on their legs and ankle after the fall, but not on the arm.  They have been taking ibuprofen for pain relief, which provides minimal relief. The pain returns with movement despite medication.  No pain when specific areas are touched, except for a little pain on the back of the shoulder area.  Patient reports that she does have an appointment on Friday with her PCP to discuss this but the pain was very intense after finishing her shift last night.    Past Medical History:  Diagnosis Date   Hypothyroidism    Obesity     Patient Active Problem List   Diagnosis Date Noted   Hydradenitis 09/18/2022   Hypothyroidism 12/16/2021   PTSD (post-traumatic stress  disorder) 12/16/2021   Anxiety and depression 12/09/2017    Past Surgical History:  Procedure Laterality Date   CESAREAN SECTION     CHOLECYSTECTOMY     tonsilectomy     TUBAL LIGATION      OB History     Gravida  4   Para  4   Term  3   Preterm  1   AB  0   Living  0      SAB  0   IAB  0   Ectopic  0   Multiple  0   Live Births  0            Home Medications    Prior to Admission medications   Medication Sig Start Date End Date Taking? Authorizing Provider  levothyroxine (SYNTHROID) 200 MCG tablet Take 1 pill everyday half pill on Sundays 12/13/22   Altamese Kinde, MD    Family History Family History  Problem Relation Age of Onset   Diabetes Father    Diabetes Sister    Healthy Mother     Social History Social History   Tobacco Use   Smoking status: Every Day    Current packs/day: 0.25    Average packs/day: 0.3 packs/day for 13.0 years (3.3 ttl pk-yrs)    Types: Cigarettes   Smokeless tobacco: Never  Vaping Use   Vaping status: Every Day   Substances: Nicotine   Devices: juul  Substance Use Topics   Alcohol use: No  Drug use: No     Allergies   Amoxicillin   Review of Systems Review of Systems  Musculoskeletal:  Positive for arthralgias and myalgias. Negative for back pain, neck pain and neck stiffness.     Physical Exam Triage Vital Signs ED Triage Vitals  Encounter Vitals Group     BP 04/23/23 0824 124/86     Systolic BP Percentile --      Diastolic BP Percentile --      Pulse Rate 04/23/23 0824 70     Resp 04/23/23 0824 16     Temp 04/23/23 0824 98.1 F (36.7 C)     Temp Source 04/23/23 0824 Oral     SpO2 04/23/23 0824 98 %     Weight 04/23/23 0824 300 lb (136.1 kg)     Height 04/23/23 0824 5\' 10"  (1.778 m)     Head Circumference --      Peak Flow --      Pain Score 04/23/23 0825 5     Pain Loc --      Pain Education --      Exclude from Growth Chart --    No data found.  Updated Vital Signs BP  124/86 (BP Location: Right Arm)   Pulse 70   Temp 98.1 F (36.7 C) (Oral)   Resp 16   Ht 5\' 10"  (1.778 m)   Wt 300 lb (136.1 kg)   LMP 03/31/2023 (Approximate)   SpO2 98%   BMI 43.05 kg/m   Visual Acuity Right Eye Distance:   Left Eye Distance:   Bilateral Distance:    Right Eye Near:   Left Eye Near:    Bilateral Near:     Physical Exam Vitals reviewed.  Constitutional:      Appearance: Normal appearance.  Musculoskeletal:     Right shoulder: Tenderness present. No swelling or deformity. Decreased range of motion. Normal strength. Normal pulse.     Right wrist: Normal pulse.     Comments: Patient reports tenderness to the posterior aspect of the right shoulder with palpation along the joint line.  No obvious deformities to palpation. Right shoulder ROM appears symmetrical and intact with the left shoulder ROM with regards to flexion, extension, abduction, adduction. Positive empty can testing on the right.  Negative drop arm test Radial pulse 2+ and symmetric bilaterally. Shoulder strength is 5/5 bilaterally.  Grip strength is 5/5 bilaterally  Skin:    General: Skin is warm and dry.  Neurological:     General: No focal deficit present.     Mental Status: She is alert and oriented to person, place, and time.  Psychiatric:        Mood and Affect: Mood normal.        Behavior: Behavior normal.        Thought Content: Thought content normal.        Judgment: Judgment normal.      UC Treatments / Results  Labs (all labs ordered are listed, but only abnormal results are displayed) Labs Reviewed - No data to display  EKG   Radiology DG Shoulder Right Result Date: 04/23/2023 CLINICAL DATA:  Fall. Continued right shoulder pain. Right elbow pain for 4 months. EXAM: RIGHT SHOULDER - 2+ VIEW COMPARISON:  None Available. FINDINGS: Normal bone mineralization. The glenohumeral acromioclavicular joints are appropriately aligned. No significant osteoarthritis. No acute  fracture or dislocation. The visualized portion of the right lung is unremarkable. IMPRESSION: Normal right shoulder radiographs. Electronically Signed   By:  Neita Garnet M.D.   On: 04/23/2023 10:13    Procedures Procedures (including critical care time)  Medications Ordered in UC Medications  ketorolac (TORADOL) 30 MG/ML injection 30 mg (30 mg Intramuscular Given 04/23/23 0917)    Initial Impression / Assessment and Plan / UC Course  I have reviewed the triage vital signs and the nursing notes.  Pertinent labs & imaging results that were available during my care of the patient were reviewed by me and considered in my medical decision making (see chart for details).      Final Clinical Impressions(s) / UC Diagnoses   Final diagnoses:  Arm pain, right  Chronic right shoulder pain   Patient presents today for ongoing right shoulder and arm pain has been ongoing for the past 4 months.  Right Arm Pain Chronic right arm pain for four months, radiating from the elbow to the shoulder, exacerbated by lifting and turning movements, particularly at work. Tenderness noted in the shoulder area. Differential diagnosis includes soft tissue injury, overuse injury, rotator cuff injury, adhesive capsulitis. Limited relief with ibuprofen. Discussed potential for soft tissue injury and need for physical therapy if x-ray is normal. Explained Toradol injection as a stronger NSAID providing pain relief for about a week. Advised to avoid NSAIDs for 2-3 days and use acetaminophen instead. Emphasized follow-up with PCP for further evaluation and potential physical therapy referral. - Order x-ray of the right arm to rule out fracture.  Right shoulder imaging was negative for signs of dislocation or acute fracture. - Administer Toradol injection for pain relief - Advise to avoid NSAIDs for 2-3 days and use acetaminophen instead - Recommend follow-up with PCP for potential physical therapy referral if x-ray is  normal - Provide work excuse for three days for rest and recovery  Follow-up - Follow up with PCP for further evaluation and potential physical therapy referral for ongoing management      Discharge Instructions      Your shoulder x-rays were normal at this time.  I suspect that you likely have a soft tissue strain likely from repetitive use.  We have provided you with a Toradol injection to help with pain management.  This is an NSAID for the next 2 to 3 days I recommend that you do not take oral NSAID's at home.  You can continue to use Tylenol as needed for further pain management.  I do recommend using warm compresses, gentle massage of performing some of the stretches that I have included in your paperwork to help with pain management.  I have provided you with a work note to excuse you until you are seen by your primary care provider.  Please follow-up with them as you likely will need physical therapy for further management.  If your symptoms are not improving you may need additional imaging to rule out soft tissue injury such as a rotator cuff concern.  If it anytime you start to notice discoloration, numbness, tingling, obvious weakness to the area please go to the emergency room for further evaluation and management.     ED Prescriptions   None    PDMP not reviewed this encounter.   Providence Crosby, PA-C 04/23/23 [id number]

## 2023-04-23 NOTE — ED Triage Notes (Signed)
 Patient here today with c/o right elbow pain X 4 month. Patient states that it feels muscular. Patient did have a fall prior to when the pain started.

## 2023-04-25 ENCOUNTER — Telehealth (INDEPENDENT_AMBULATORY_CARE_PROVIDER_SITE_OTHER): Payer: Self-pay | Admitting: Primary Care

## 2023-04-25 NOTE — Telephone Encounter (Signed)
 Left VM with pt about their upcoming apt.

## 2023-04-26 ENCOUNTER — Ambulatory Visit (INDEPENDENT_AMBULATORY_CARE_PROVIDER_SITE_OTHER): Payer: Medicaid Other | Admitting: Primary Care

## 2023-04-26 ENCOUNTER — Encounter (INDEPENDENT_AMBULATORY_CARE_PROVIDER_SITE_OTHER): Payer: Self-pay | Admitting: Primary Care

## 2023-04-26 VITALS — BP 119/78 | HR 80 | Wt 293.6 lb

## 2023-04-26 DIAGNOSIS — M79601 Pain in right arm: Secondary | ICD-10-CM

## 2023-04-26 NOTE — Progress Notes (Signed)
  [facility]  Abigail Rangel, is a 38 y.o. female  ZOX:[id number]  [id number]  DOB - 11/26/1985  Chief Complaint  Patient presents with   Arm Pain    Patient states that its been happening for 4 month, (elbow area)       Subjective:   Abigail Rangel is a 38 y.o. female here today for an acute visit.Patient states that its been happening for 4 month, (elbow area). Seen by UC no fx will refer to ortho.  Aggravating factors are lifting anything, trying to grasp or hold a cup, twisting wrist the more of arm causing throbbing pain . Pain 7/10 Taking ibuprofen  ROS Pertinent positive and negative noted in HPI   Allergies  Allergen Reactions   Amoxicillin Hives    Past Medical History:  Diagnosis Date   Hypothyroidism    Obesity     Current Outpatient Medications on File Prior to Visit  Medication Sig Dispense Refill   levothyroxine (SYNTHROID) 200 MCG tablet Take 1 pill everyday half pill on Sundays 90 tablet 1   No current facility-administered medications on file prior to visit.   Health Maintenance  Topic Date Due   Pneumococcal Vaccination (1 of 2 - PCV) Never done   HIV Screening  Never done   Hepatitis C Screening  Never done   Flu Shot  Never done   COVID-19 Vaccine (1 - 2024-25 season) Never done   Pap with HPV screening  09/17/2025   DTaP/Tdap/Td vaccine (2 - Td or Tdap) 06/30/2028   HPV Vaccine  Aged Out    Objective:   Vitals:   04/26/23 1028  BP: 119/78  Pulse: 80  SpO2: 99%  Weight: 293 lb 9.6 oz (133.2 kg)    Assessment & Plan  Abigail Rangel was seen today for arm pain.  Diagnoses and all orders for this visit:  Pain of right upper extremity -     Ambulatory referral to Orthopedic Surgery    Patient have been counseled extensively about nutrition and exercise. Other issues discussed during this visit include: low cholesterol diet, weight control and daily exercise, foot care, annual eye examinations at Ophthalmology, importance of  adherence with medications and regular follow-up. We also discussed long term complications of uncontrolled diabetes and hypertension.    The patient was given clear instructions to go to ER or return to medical center if symptoms don't improve, worsen or new problems develop. The patient verbalized understanding. The patient was told to call to get lab results if they haven't heard anything in the next week.   This note has been created with Education officer, environmental. Any transcriptional errors are unintentional.   Grayce Sessions, NP 04/26/2023, 11:02 AM

## 2023-05-09 ENCOUNTER — Encounter (INDEPENDENT_AMBULATORY_CARE_PROVIDER_SITE_OTHER): Payer: Self-pay | Admitting: Primary Care

## 2023-06-24 ENCOUNTER — Encounter (HOSPITAL_COMMUNITY): Payer: Self-pay | Admitting: *Deleted

## 2023-06-24 ENCOUNTER — Ambulatory Visit (HOSPITAL_COMMUNITY)
Admission: EM | Admit: 2023-06-24 | Discharge: 2023-06-24 | Disposition: A | Discharge status: Home / Self Care | Attending: Emergency Medicine | Admitting: Emergency Medicine

## 2023-06-24 ENCOUNTER — Other Ambulatory Visit: Payer: Self-pay

## 2023-06-24 DIAGNOSIS — Z207 Contact with and (suspected) exposure to pediculosis, acariasis and other infestations: Secondary | ICD-10-CM

## 2023-06-24 MED ORDER — PERMETHRIN 5 % EX CREA: TOPICAL_CREAM | CUTANEOUS | 0 refills | Status: DC

## 2023-06-24 MED ORDER — PERMETHRIN 1 % EX LIQD: 1.0000 | Freq: Once | CUTANEOUS | 0 refills | Status: AC

## 2023-06-24 NOTE — ED Provider Notes (Signed)
 [facility]    CSN: [id number] Arrival date & time: 06/24/23  1555     History   Chief Complaint Chief Complaint  Patient presents with   Head Lice    HPI Abigail Rangel is a 38 y.o. female.  Here with concerns for head lice exposure. Her child noticed when washing hair last night. Son had exposure at school a week ago too. Patient has not seen any lice on her, but reports itching of scalp  Past Medical History:  Diagnosis Date   Hypothyroidism    Obesity     Patient Active Problem List   Diagnosis Date Noted   Hydradenitis 09/18/2022   Hypothyroidism 12/16/2021   PTSD (post-traumatic stress disorder) 12/16/2021   Anxiety and depression 12/09/2017    Past Surgical History:  Procedure Laterality Date   CESAREAN SECTION     CHOLECYSTECTOMY     tonsilectomy     TUBAL LIGATION      OB History     Gravida  4   Para  4   Term  3   Preterm  1   AB  0   Living  0      SAB  0   IAB  0   Ectopic  0   Multiple  0   Live Births  0            Home Medications    Prior to Admission medications   Medication Sig Start Date End Date Taking? Authorizing Provider  permethrin (NIX) 1 % external liquid Apply 1 Application topically once for 1 dose. Use as directed 06/24/23 06/24/23 Yes Carole Doner, Ivette Marks, PA-C  levothyroxine  (SYNTHROID ) 200 MCG tablet Take 1 pill everyday half pill on Sundays 12/13/22   Jorge Newcomer, MD    Family History Family History  Problem Relation Age of Onset   Diabetes Father    Diabetes Sister    Healthy Mother     Social History Social History   Tobacco Use   Smoking status: Every Day    Current packs/day: 0.25    Average packs/day: 0.3 packs/day for 13.0 years (3.3 ttl pk-yrs)    Types: Cigarettes   Smokeless tobacco: Never  Vaping Use   Vaping status: Every Day   Substances: Nicotine   Devices: juul  Substance Use Topics   Alcohol use: No   Drug use: No     Allergies    Amoxicillin   Review of Systems Review of Systems Per HPI  Physical Exam Triage Vital Signs ED Triage Vitals  Encounter Vitals Group     BP      Systolic BP Percentile      Diastolic BP Percentile      Pulse      Resp      Temp      Temp src      SpO2      Weight      Height      Head Circumference      Peak Flow      Pain Score      Pain Loc      Pain Education      Exclude from Growth Chart    No data found.  Updated Vital Signs BP 121/78   Pulse 66   Temp 97.9 F (36.6 C)   Resp 18   LMP 06/10/2023 (Approximate)   SpO2 93%   Physical Exam Vitals and nursing note reviewed.  Constitutional:  General: She is not in acute distress.    Appearance: Normal appearance.  HENT:     Head:     Comments: Normal exam Cardiovascular:     Rate and Rhythm: Normal rate and regular rhythm.     Heart sounds: Normal heart sounds.  Pulmonary:     Effort: Pulmonary effort is normal.     Breath sounds: Normal breath sounds.  Neurological:     Mental Status: She is alert and oriented to person, place, and time.     UC Treatments / Results  Labs (all labs ordered are listed, but only abnormal results are displayed) Labs Reviewed - No data to display  EKG   Radiology No results found.  Procedures Procedures (including critical care time)  Medications Ordered in UC Medications - No data to display  Initial Impression / Assessment and Plan / UC Course  I have reviewed the triage vital signs and the nursing notes.  Pertinent labs & imaging results that were available during my care of the patient were reviewed by me and considered in my medical decision making (see chart for details).  Possible head lice exposure Treat with permethrin liquid single application, repeat in 1 week if needed. Advised combing through hair  Work note provided   Final Clinical Impressions(s) / UC Diagnoses   Final diagnoses:  Exposure to head lice     Discharge  Instructions      After hair has been washed with shampoo, rinsed with water and towel dried, apply a sufficient volume of Nix liquid to saturate the hair and scalp. Also apply behind the ears and at the base of the neck. Leave on hair for 10 minutes before rinsing off with warm water; remove remaining lice with comb. May repeat application in 7 to 10 days if live lice or nits observed.     ED Prescriptions     Medication Sig Dispense Auth. Provider   permethrin (ELIMITE) 5 % cream  (Status: Discontinued) Apply to affected area once 60 g Zahra Peffley, PA-C   permethrin (NIX) 1 % external liquid Apply 1 Application topically once for 1 dose. Use as directed 59 mL Nox Talent, Ivette Marks, PA-C      PDMP not reviewed this encounter.   Newton Barer 06/24/23 1810

## 2023-06-24 NOTE — ED Triage Notes (Signed)
 PT reports one child saw head lice last night while washing hair. Pt has not seen any lice on her

## 2023-06-24 NOTE — Discharge Instructions (Addendum)
 After hair has been washed with shampoo, rinsed with water and towel dried, apply a sufficient volume of Nix liquid to saturate the hair and scalp. Also apply behind the ears and at the base of the neck. Leave on hair for 10 minutes before rinsing off with warm water; remove remaining lice with comb. May repeat application in 7 to 10 days if live lice or nits observed.

## 2023-06-26 ENCOUNTER — Ambulatory Visit: Admitting: "Endocrinology

## 2023-08-15 ENCOUNTER — Other Ambulatory Visit: Payer: Self-pay | Admitting: "Endocrinology

## 2023-08-16 NOTE — Telephone Encounter (Signed)
 Requested Prescriptions   Pending Prescriptions Disp Refills   levothyroxine  (SYNTHROID ) 200 MCG tablet [Pharmacy Med Name: LEVOTHYROXINE  0.2MG  ( ) TAB] 90 tablet 1    Sig: TAKE 1 TABLET BY MOUTH DAILY AND HALF A TABLET ON SUNDAYS

## 2023-09-18 ENCOUNTER — Ambulatory Visit (HOSPITAL_COMMUNITY)

## 2023-10-08 ENCOUNTER — Ambulatory Visit (HOSPITAL_COMMUNITY)
Admission: RE | Admit: 2023-10-08 | Discharge: 2023-10-08 | Disposition: A | Discharge status: Home / Self Care | Source: Ambulatory Visit | Attending: Emergency Medicine | Admitting: Emergency Medicine

## 2023-10-08 ENCOUNTER — Encounter (HOSPITAL_COMMUNITY): Payer: Self-pay

## 2023-10-08 VITALS — BP 108/76 | HR 63 | Temp 98.4°F | Resp 18

## 2023-10-08 DIAGNOSIS — R590 Localized enlarged lymph nodes: Secondary | ICD-10-CM | POA: Insufficient documentation

## 2023-10-08 DIAGNOSIS — R5383 Other fatigue: Secondary | ICD-10-CM | POA: Diagnosis not present

## 2023-10-08 LAB — CBC WITH DIFFERENTIAL/PLATELET
Abs Immature Granulocytes: 0.03 K/uL (ref 0.00–0.07)
Basophils Absolute: 0.1 K/uL (ref 0.0–0.1)
Basophils Relative: 1 %
Eosinophils Absolute: 0.1 K/uL (ref 0.0–0.5)
Eosinophils Relative: 1 %
HCT: 33.9 % — ABNORMAL LOW (ref 36.0–46.0)
Hemoglobin: 10.4 g/dL — ABNORMAL LOW (ref 12.0–15.0)
Immature Granulocytes: 0 %
Lymphocytes Relative: 23 %
Lymphs Abs: 1.9 K/uL (ref 0.7–4.0)
MCH: 20.5 pg — ABNORMAL LOW (ref 26.0–34.0)
MCHC: 30.7 g/dL (ref 30.0–36.0)
MCV: 66.9 fL — ABNORMAL LOW (ref 80.0–100.0)
Monocytes Absolute: 0.6 K/uL (ref 0.1–1.0)
Monocytes Relative: 8 %
Neutro Abs: 5.6 K/uL (ref 1.7–7.7)
Neutrophils Relative %: 67 %
Platelets: 225 K/uL (ref 150–400)
RBC: 5.07 MIL/uL (ref 3.87–5.11)
RDW: 16.9 % — ABNORMAL HIGH (ref 11.5–15.5)
WBC: 8.3 K/uL (ref 4.0–10.5)
nRBC: 0 % (ref 0.0–0.2)

## 2023-10-08 LAB — COMPREHENSIVE METABOLIC PANEL WITH GFR
ALT: 20 U/L (ref 0–44)
AST: 48 U/L — ABNORMAL HIGH (ref 15–41)
Albumin: 3.7 g/dL (ref 3.5–5.0)
Alkaline Phosphatase: 65 U/L (ref 38–126)
Anion gap: 6 (ref 5–15)
BUN: 11 mg/dL (ref 6–20)
CO2: 26 mmol/L (ref 22–32)
Calcium: 9.2 mg/dL (ref 8.9–10.3)
Chloride: 105 mmol/L (ref 98–111)
Creatinine, Ser: 0.75 mg/dL (ref 0.44–1.00)
GFR, Estimated: >60 mL/min (ref >60)
Glucose, Bld: 72 mg/dL (ref 70–99)
Potassium: 4.6 mmol/L (ref 3.5–5.1)
Sodium: 137 mmol/L (ref 135–145)
Total Bilirubin: 1.5 mg/dL — ABNORMAL HIGH (ref 0.0–1.2)
Total Protein: 7.4 g/dL (ref 6.5–8.1)

## 2023-10-08 LAB — TSH: TSH: 6.579 u[IU]/mL — ABNORMAL HIGH (ref 0.350–4.500)

## 2023-10-08 NOTE — ED Triage Notes (Signed)
 Pt states her throat doesn't hurt but it is swollen and making her have right ear pain X 3 days. She took IBU which did give relief.

## 2023-10-08 NOTE — ED Provider Notes (Signed)
 [facility]    CSN: [id number] Arrival date & time: 10/08/23  1631      History   Chief Complaint Chief Complaint  Patient presents with   Sore Throat    Entered by patient    HPI Abigail Rangel is a 38 y.o. female.   Patient states that she has noticed some swelling to her neck over the last 3 days.  Patient states that her neck is tender to touch as well.  Patient states that she is not having pain radiate to her right ear.  Patient states that she has also had some generalized fatigue.  Patient states that she has been having ongoing fatigue for the last few months, but reports that it has increased some over the last few days.  Denies fever, body aches, chills, cough, congestion, sore throat, headache, nausea, vomiting, diarrhea, abdominal pain, chest pain, and shortness of breath.  Patient does report a history of hypothyroidism.  Patient states that she does take levothyroxine  daily as prescribed.  Patient was concerned that the swelling in her neck could be related to her thyroid .  The history is provided by the patient and medical records.  Sore Throat    Past Medical History:  Diagnosis Date   Hypothyroidism    Obesity     Patient Active Problem List   Diagnosis Date Noted   Hydradenitis 09/18/2022   Hypothyroidism 12/16/2021   PTSD (post-traumatic stress disorder) 12/16/2021   Anxiety and depression 12/09/2017    Past Surgical History:  Procedure Laterality Date   CESAREAN SECTION     CHOLECYSTECTOMY     tonsilectomy     TUBAL LIGATION      OB History     Gravida  4   Para  4   Term  3   Preterm  1   AB  0   Living  0      SAB  0   IAB  0   Ectopic  0   Multiple  0   Live Births  0            Home Medications    Prior to Admission medications   Medication Sig Start Date End Date Taking? Authorizing Provider  levothyroxine  (SYNTHROID ) 200 MCG tablet TAKE 1 TABLET BY MOUTH DAILY AND HALF A TABLET ON SUNDAYS  08/16/23  Yes Motwani, Komal, MD    Family History Family History  Problem Relation Age of Onset   Diabetes Father    Diabetes Sister    Healthy Mother     Social History Social History   Tobacco Use   Smoking status: Every Day    Current packs/day: 0.25    Average packs/day: 0.3 packs/day for 13.0 years (3.3 ttl pk-yrs)    Types: Cigarettes   Smokeless tobacco: Never  Vaping Use   Vaping status: Former   Substances: Nicotine   Devices: juul  Substance Use Topics   Alcohol use: No     Allergies   Amoxicillin   Review of Systems Review of Systems  Per HPI  Physical Exam Triage Vital Signs ED Triage Vitals  Encounter Vitals Group     BP 10/08/23 1725 108/76     Girls Systolic BP Percentile --      Girls Diastolic BP Percentile --      Boys Systolic BP Percentile --      Boys Diastolic BP Percentile --      Pulse Rate 10/08/23 1725 63  Resp 10/08/23 1725 18     Temp 10/08/23 1725 98.4 F (36.9 C)     Temp Source 10/08/23 1725 Oral     SpO2 10/08/23 1725 96 %     Weight --      Height --      Head Circumference --      Peak Flow --      Pain Score 10/08/23 1722 5     Pain Loc --      Pain Education --      Exclude from Growth Chart --    No data found.  Updated Vital Signs BP 108/76 (BP Location: Left Arm)   Pulse 63   Temp 98.4 F (36.9 C) (Oral)   Resp 18   LMP 09/01/2023 Comment: pt states tubal cant have babies  SpO2 96%   Visual Acuity Right Eye Distance:   Left Eye Distance:   Bilateral Distance:    Right Eye Near:   Left Eye Near:    Bilateral Near:     Physical Exam Vitals and nursing note reviewed.  Constitutional:      General: She is awake. She is not in acute distress.    Appearance: Normal appearance. She is well-developed and well-groomed. She is not ill-appearing.  HENT:     Head:     Comments: Mild left occipital lymphadenopathy noted    Right Ear: Tympanic membrane, ear canal and external ear normal.     Left  Ear: Tympanic membrane, ear canal and external ear normal.     Mouth/Throat:     Mouth: Mucous membranes are moist.     Pharynx: Oropharynx is clear. Uvula midline.     Tonsils: No tonsillar exudate or tonsillar abscesses.  Neck:     Thyroid : No thyroid  mass, thyromegaly or thyroid  tenderness.     Comments: Bilateral submandibular lymphadenopathy noted. Musculoskeletal:     Cervical back: Full passive range of motion without pain, normal range of motion and neck supple. No pain with movement, spinous process tenderness or muscular tenderness. Normal range of motion.  Lymphadenopathy:     Cervical: Cervical adenopathy present.  Skin:    General: Skin is warm and dry.  Neurological:     Mental Status: She is alert.  Psychiatric:        Behavior: Behavior is cooperative.      UC Treatments / Results  Labs (all labs ordered are listed, but only abnormal results are displayed) Labs Reviewed  CBC WITH DIFFERENTIAL/PLATELET  COMPREHENSIVE METABOLIC PANEL WITH GFR  TSH    EKG   Radiology No results found.  Procedures Procedures (including critical care time)  Medications Ordered in UC Medications - No data to display  Initial Impression / Assessment and Plan / UC Course  I have reviewed the triage vital signs and the nursing notes.  Pertinent labs & imaging results that were available during my care of the patient were reviewed by me and considered in my medical decision making (see chart for details).     Patient is overall well-appearing.  Vitals are stable.  Ordered CBC and CMP to rule out underlying causes related to lymphadenopathy and fatigue.  Ordered TSH to ensure symptoms are not related to hypothyroidism.  Discussed follow-up, return, and strict ER precautions. Final Clinical Impressions(s) / UC Diagnoses   Final diagnoses:  Lymphadenopathy of head and neck region  Other fatigue     Discharge Instructions      Continue taking your levothyroxine  as  prescribed. We have drawn some basic lab work to rule out underlying causes for your lymphadenopathy and fatigue today. If any of these results are concerning you will receive a phone call advising any additional treatment. Otherwise follow-up with your primary care provider for further evaluation and management of the symptoms. If you develop increased swelling, difficulty breathing, or high fevers unrelieved by medication please seek medical treatment emergency department.   ED Prescriptions   None    PDMP not reviewed this encounter.   Johnie Flaming A, NP 10/08/23 1800

## 2023-10-08 NOTE — Discharge Instructions (Signed)
 Continue taking your levothyroxine  as prescribed. We have drawn some basic lab work to rule out underlying causes for your lymphadenopathy and fatigue today. If any of these results are concerning you will receive a phone call advising any additional treatment. Otherwise follow-up with your primary care provider for further evaluation and management of the symptoms. If you develop increased swelling, difficulty breathing, or high fevers unrelieved by medication please seek medical treatment emergency department.

## 2023-10-09 ENCOUNTER — Ambulatory Visit (HOSPITAL_COMMUNITY): Payer: Self-pay

## 2024-03-16 ENCOUNTER — Ambulatory Visit (HOSPITAL_COMMUNITY)
Admission: RE | Admit: 2024-03-16 | Discharge: 2024-03-16 | Disposition: A | Discharge status: Home / Self Care | Source: Ambulatory Visit

## 2024-03-16 ENCOUNTER — Encounter (HOSPITAL_COMMUNITY): Payer: Self-pay

## 2024-03-16 VITALS — BP 122/77 | HR 105 | Temp 99.1°F | Resp 20

## 2024-03-16 DIAGNOSIS — R051 Acute cough: Secondary | ICD-10-CM

## 2024-03-16 DIAGNOSIS — J111 Influenza due to unidentified influenza virus with other respiratory manifestations: Secondary | ICD-10-CM | POA: Diagnosis not present

## 2024-03-16 LAB — POCT INFLUENZA A/B
Influenza A, POC: NEGATIVE
Influenza B, POC: NEGATIVE

## 2024-03-16 LAB — POC SOFIA SARS ANTIGEN FIA: SARS Coronavirus 2 Ag: NEGATIVE

## 2024-03-16 MED ORDER — ONDANSETRON 4 MG PO TBDP: 4.0000 mg | ORAL_TABLET | Freq: Three times a day (TID) | ORAL | 0 refills | Status: AC | PRN

## 2024-03-16 MED ORDER — OSELTAMIVIR PHOSPHATE 75 MG PO CAPS: 75.0000 mg | ORAL_CAPSULE | Freq: Two times a day (BID) | ORAL | 0 refills | Status: AC

## 2024-03-16 NOTE — Discharge Instructions (Addendum)
 Your flu and COVID swabs are negative. Your presentation is consistent with influenza and there is high incidence in the community, therefore you will be treated as if you are positive.  You have been given a prescription for Tamiflu .  Take 1 capsule every 12 hours for 5 days. You have been given a prescription for ondansetron .  Take 1 tablet every 8 hours as needed for nausea, vomiting.  Influenza is a viral illness which will improve with rest, fluids, and medications to help with your symptoms.  Tylenol  and/or ibuprofen  for fever, body aches, pain. Guaifenesin (plain mucinex) and saline nasal sprays may help relieve symptoms.  2 teaspoons of honey and 1 cup of warm water every 4-6 hours may help with throat pains.  Salt water gargles, 1/2-1 teaspoon salt dissolved in 1 cup warm water, gargle and spit out several times daily for sore throat.  Humidifier in room at nighttime may help sooth cough (clean well daily).  For chest pain, shortness of breath, inability to keep food or fluids down without vomiting, fever that does not respond to Tylenol  or ibuprofen , or any other severe symptoms, please go to the ER for further evaluation.  Return to urgent care as needed, otherwise follow-up with PCP.

## 2024-03-16 NOTE — ED Triage Notes (Signed)
 Pt c/o cough, body aches, and chills x2 days. States chest hurts from coughing. States can't sleep. States taken day/nyquil.

## 2024-03-16 NOTE — ED Provider Notes (Signed)
 " [facility]    CSN: [id number] Arrival date & time: 03/16/24  1800      History   Chief Complaint No chief complaint on file.   HPI Abigail Rangel is a 39 y.o. female.   This 39 year old female is being seen for complaints of headache, bodyaches, chills, nasal congestion, cough, nausea for 2 days.  She has taken DayQuil and NyQuil with minimal relief of symptoms.  She denies dizziness, sore throat, chest pain, shortness of breath.  She denies abdominal pain, vomiting, diarrhea.     Past Medical History:  Diagnosis Date   Hypothyroidism    Obesity     Patient Active Problem List   Diagnosis Date Noted   Hydradenitis 09/18/2022   Hypothyroidism 12/16/2021   PTSD (post-traumatic stress disorder) 12/16/2021   Anxiety and depression 12/09/2017    Past Surgical History:  Procedure Laterality Date   CESAREAN SECTION     CHOLECYSTECTOMY     tonsilectomy     TUBAL LIGATION      OB History     Gravida  4   Para  4   Term  3   Preterm  1   AB  0   Living  0      SAB  0   IAB  0   Ectopic  0   Multiple  0   Live Births  0            Home Medications    Prior to Admission medications  Medication Sig Start Date End Date Taking? Authorizing Provider  ondansetron  (ZOFRAN -ODT) 4 MG disintegrating tablet Take 1 tablet (4 mg total) by mouth every 8 (eight) hours as needed for nausea or vomiting. 03/16/24  Yes Jeidi Gilles C, FNP  oseltamivir  (TAMIFLU ) 75 MG capsule Take 1 capsule (75 mg total) by mouth every 12 (twelve) hours. 03/16/24  Yes Keily Lepp C, FNP  levothyroxine  (SYNTHROID ) 200 MCG tablet TAKE 1 TABLET BY MOUTH DAILY AND HALF A TABLET ON SUNDAYS 08/16/23   Dartha Ernst, MD    Family History Family History  Problem Relation Age of Onset   Diabetes Father    Diabetes Sister    Healthy Mother     Social History Social History[1]   Allergies   Amoxicillin   Review of Systems Review of Systems  Constitutional:   Positive for activity change, appetite change and chills. Negative for fever.  HENT:  Positive for congestion. Negative for ear pain.   Eyes:  Negative for visual disturbance.  Respiratory:  Positive for cough. Negative for shortness of breath.   Cardiovascular:  Negative for chest pain.  Gastrointestinal:  Positive for nausea. Negative for abdominal pain, diarrhea and vomiting.  Musculoskeletal:  Positive for myalgias.  Skin:  Negative for color change and rash.  Neurological:  Positive for headaches. Negative for dizziness.  All other systems reviewed and are negative.    Physical Exam Triage Vital Signs ED Triage Vitals  Encounter Vitals Group     BP 03/16/24 1858 122/77     Girls Systolic BP Percentile --      Girls Diastolic BP Percentile --      Boys Systolic BP Percentile --      Boys Diastolic BP Percentile --      Pulse Rate 03/16/24 1858 (!) 105     Resp 03/16/24 1858 20     Temp 03/16/24 1858 99.1 F (37.3 C)     Temp Source 03/16/24 1858 Oral  SpO2 03/16/24 1858 96 %     Weight --      Height --      Head Circumference --      Peak Flow --      Pain Score 03/16/24 1857 8     Pain Loc --      Pain Education --      Exclude from Growth Chart --    No data found.  Updated Vital Signs BP 122/77 (BP Location: Left Arm)   Pulse (!) 105   Temp 99.1 F (37.3 C) (Oral)   Resp 20   SpO2 96%   Visual Acuity Right Eye Distance:   Left Eye Distance:   Bilateral Distance:    Right Eye Near:   Left Eye Near:    Bilateral Near:     Physical Exam Vitals and nursing note reviewed.  Constitutional:      General: She is not in acute distress.    Appearance: She is well-developed. She is ill-appearing. She is not toxic-appearing.     Comments: Pleasant female appearing stated age found sitting in chair in no acute distress.  HENT:     Head: Normocephalic and atraumatic.     Right Ear: Tympanic membrane and external ear normal.     Left Ear: Tympanic membrane  and external ear normal.     Nose: Congestion and rhinorrhea present.     Right Turbinates: Enlarged.     Left Turbinates: Enlarged.     Mouth/Throat:     Lips: Pink.     Mouth: Mucous membranes are moist.     Pharynx: No oropharyngeal exudate or posterior oropharyngeal erythema.  Eyes:     Conjunctiva/sclera: Conjunctivae normal.  Cardiovascular:     Rate and Rhythm: Normal rate and regular rhythm.     Heart sounds: Normal heart sounds. No murmur heard. Pulmonary:     Effort: Pulmonary effort is normal. No respiratory distress.     Breath sounds: Normal breath sounds.  Abdominal:     General: Bowel sounds are normal.     Palpations: Abdomen is soft.     Tenderness: There is no abdominal tenderness.  Musculoskeletal:     Cervical back: Neck supple.  Skin:    General: Skin is warm and dry.     Capillary Refill: Capillary refill takes less than 2 seconds.  Neurological:     Mental Status: She is alert.  Psychiatric:        Mood and Affect: Mood normal.      UC Treatments / Results  Labs (all labs ordered are listed, but only abnormal results are displayed) Labs Reviewed  POCT INFLUENZA A/B  POC SOFIA SARS ANTIGEN FIA    EKG   Radiology No results found.  Procedures Procedures (including critical care time)  Medications Ordered in UC Medications - No data to display  Initial Impression / Assessment and Plan / UC Course  I have reviewed the triage vital signs and the nursing notes.  Pertinent labs & imaging results that were available during my care of the patient were reviewed by me and considered in my medical decision making (see chart for details).     Vitals and triage reviewed, patient is hemodynamically stable.  COVID and flu swabs obtained and are negative.  Symptoms and physical presentation consistent with influenza, therefore patient will be treated as if she were positive.  Prescription given for Tamiflu , ondansetron .  Advised supportive care with  Tylenol  and/or ibuprofen , guaifenesin, saline  nasal spray.  Advised honey water, salt water gargles, cool-mist humidifier.  Plan of care, follow-up care, return precautions given, no questions at this time. Final Clinical Impressions(s) / UC Diagnoses   Final diagnoses:  Acute cough  Influenza-like illness     Discharge Instructions      Your flu and COVID swabs are negative. Your presentation is consistent with influenza and there is high incidence in the community, therefore you will be treated as if you are positive.  You have been given a prescription for Tamiflu .  Take 1 capsule every 12 hours for 5 days. You have been given a prescription for ondansetron .  Take 1 tablet every 8 hours as needed for nausea, vomiting.  Influenza is a viral illness which will improve with rest, fluids, and medications to help with your symptoms.  Tylenol  and/or ibuprofen  for fever, body aches, pain. Guaifenesin (plain mucinex) and saline nasal sprays may help relieve symptoms.  2 teaspoons of honey and 1 cup of warm water every 4-6 hours may help with throat pains.  Salt water gargles, 1/2-1 teaspoon salt dissolved in 1 cup warm water, gargle and spit out several times daily for sore throat.  Humidifier in room at nighttime may help sooth cough (clean well daily).  For chest pain, shortness of breath, inability to keep food or fluids down without vomiting, fever that does not respond to Tylenol  or ibuprofen , or any other severe symptoms, please go to the ER for further evaluation.  Return to urgent care as needed, otherwise follow-up with PCP.      ED Prescriptions     Medication Sig Dispense Auth. Provider   oseltamivir  (TAMIFLU ) 75 MG capsule Take 1 capsule (75 mg total) by mouth every 12 (twelve) hours. 10 capsule David Towson C, FNP   ondansetron  (ZOFRAN -ODT) 4 MG disintegrating tablet Take 1 tablet (4 mg total) by mouth every 8 (eight) hours as needed for nausea or vomiting. 12 tablet  Zacharey Jensen C, FNP      PDMP not reviewed this encounter.    [1]  Social History Tobacco Use   Smoking status: Every Day    Current packs/day: 0.25    Average packs/day: 0.3 packs/day for 13.0 years (3.3 ttl pk-yrs)    Types: Cigarettes   Smokeless tobacco: Never  Vaping Use   Vaping status: Former   Substances: Nicotine   Devices: juul  Substance Use Topics   Alcohol use: No   Drug use: Not Currently     Lennice Jon BROCKS, FNP 03/16/24 2000  "
